# Patient Record
Sex: Male | Born: 1975 | Race: White | Hispanic: No | Marital: Married | Smoking: Current every day smoker
Health system: Southern US, Community
[De-identification: ages and names within clinical notes are randomized; demographics above are authoritative.]

## PROBLEM LIST (undated history)

## (undated) DIAGNOSIS — F988 Other specified behavioral and emotional disorders with onset usually occurring in childhood and adolescence: Secondary | ICD-10-CM

---

## 1999-06-29 ENCOUNTER — Emergency Department (HOSPITAL_COMMUNITY): Admission: EM | Admit: 1999-06-29 | Discharge: 1999-06-29 | Payer: Self-pay | Admitting: Emergency Medicine

## 1999-09-23 ENCOUNTER — Emergency Department (HOSPITAL_COMMUNITY): Admission: EM | Admit: 1999-09-23 | Discharge: 1999-09-23 | Payer: Self-pay | Admitting: Emergency Medicine

## 1999-09-23 ENCOUNTER — Encounter: Payer: Self-pay | Admitting: Emergency Medicine

## 2001-02-01 ENCOUNTER — Encounter: Payer: Self-pay | Admitting: Emergency Medicine

## 2001-02-01 ENCOUNTER — Emergency Department (HOSPITAL_COMMUNITY): Admission: EM | Admit: 2001-02-01 | Discharge: 2001-02-01 | Payer: Self-pay | Admitting: Emergency Medicine

## 2002-06-14 ENCOUNTER — Emergency Department (HOSPITAL_COMMUNITY): Admission: EM | Admit: 2002-06-14 | Discharge: 2002-06-14 | Payer: Self-pay

## 2002-07-29 ENCOUNTER — Emergency Department (HOSPITAL_COMMUNITY): Admission: EM | Admit: 2002-07-29 | Discharge: 2002-07-29 | Payer: Self-pay | Admitting: Emergency Medicine

## 2003-12-07 ENCOUNTER — Emergency Department (HOSPITAL_COMMUNITY): Admission: EM | Admit: 2003-12-07 | Discharge: 2003-12-07 | Payer: Self-pay | Admitting: Emergency Medicine

## 2003-12-22 ENCOUNTER — Emergency Department (HOSPITAL_COMMUNITY): Admission: EM | Admit: 2003-12-22 | Discharge: 2003-12-22 | Payer: Self-pay | Admitting: Emergency Medicine

## 2003-12-23 ENCOUNTER — Emergency Department (HOSPITAL_COMMUNITY): Admission: EM | Admit: 2003-12-23 | Discharge: 2003-12-23 | Payer: Self-pay | Admitting: Emergency Medicine

## 2003-12-24 ENCOUNTER — Emergency Department (HOSPITAL_COMMUNITY): Admission: EM | Admit: 2003-12-24 | Discharge: 2003-12-24 | Payer: Self-pay | Admitting: Emergency Medicine

## 2003-12-27 ENCOUNTER — Emergency Department (HOSPITAL_COMMUNITY): Admission: EM | Admit: 2003-12-27 | Discharge: 2003-12-27 | Payer: Self-pay | Admitting: Emergency Medicine

## 2004-01-31 ENCOUNTER — Emergency Department (HOSPITAL_COMMUNITY): Admission: EM | Admit: 2004-01-31 | Discharge: 2004-01-31 | Payer: Self-pay | Admitting: Emergency Medicine

## 2004-02-25 ENCOUNTER — Emergency Department (HOSPITAL_COMMUNITY): Admission: EM | Admit: 2004-02-25 | Discharge: 2004-02-26 | Payer: Self-pay | Admitting: Emergency Medicine

## 2004-03-19 ENCOUNTER — Emergency Department (HOSPITAL_COMMUNITY): Admission: EM | Admit: 2004-03-19 | Discharge: 2004-03-19 | Payer: Self-pay | Admitting: Emergency Medicine

## 2006-06-22 ENCOUNTER — Emergency Department (HOSPITAL_COMMUNITY): Admission: EM | Admit: 2006-06-22 | Discharge: 2006-06-22 | Payer: Self-pay | Admitting: Pediatrics

## 2007-02-01 ENCOUNTER — Emergency Department (HOSPITAL_COMMUNITY): Admission: EM | Admit: 2007-02-01 | Discharge: 2007-02-01 | Payer: Self-pay | Admitting: Emergency Medicine

## 2007-10-06 ENCOUNTER — Emergency Department (HOSPITAL_COMMUNITY): Admission: EM | Admit: 2007-10-06 | Discharge: 2007-10-06 | Payer: Self-pay | Admitting: Emergency Medicine

## 2008-01-27 ENCOUNTER — Ambulatory Visit: Payer: Self-pay | Admitting: Family Medicine

## 2008-01-27 ENCOUNTER — Observation Stay (HOSPITAL_COMMUNITY): Admission: EM | Admit: 2008-01-27 | Discharge: 2008-01-28 | Payer: Self-pay | Admitting: Emergency Medicine

## 2008-06-05 ENCOUNTER — Emergency Department (HOSPITAL_COMMUNITY): Admission: EM | Admit: 2008-06-05 | Discharge: 2008-06-05 | Payer: Self-pay | Admitting: Family Medicine

## 2008-08-26 ENCOUNTER — Emergency Department (HOSPITAL_COMMUNITY): Admission: EM | Admit: 2008-08-26 | Discharge: 2008-08-26 | Payer: Self-pay | Admitting: Emergency Medicine

## 2008-09-27 ENCOUNTER — Emergency Department (HOSPITAL_COMMUNITY): Admission: EM | Admit: 2008-09-27 | Discharge: 2008-09-27 | Payer: Self-pay | Admitting: Emergency Medicine

## 2009-02-05 ENCOUNTER — Emergency Department (HOSPITAL_COMMUNITY): Admission: EM | Admit: 2009-02-05 | Discharge: 2009-02-05 | Payer: Self-pay | Admitting: Emergency Medicine

## 2010-04-13 LAB — POCT URINALYSIS DIP (DEVICE)
Nitrite: NEGATIVE
Protein, ur: NEGATIVE mg/dL
Urobilinogen, UA: 0.2 mg/dL (ref 0.0–1.0)
pH: 7 (ref 5.0–8.0)

## 2010-05-12 LAB — COMPREHENSIVE METABOLIC PANEL
ALT: 24 U/L (ref 0–53)
AST: 25 U/L (ref 0–37)
Albumin: 4 g/dL (ref 3.5–5.2)
Alkaline Phosphatase: 51 U/L (ref 39–117)
BUN: 14 mg/dL (ref 6–23)
Chloride: 104 mEq/L (ref 96–112)
GFR calc Af Amer: >60 mL/min (ref >60)
Potassium: 3.7 mEq/L (ref 3.5–5.1)
Sodium: 140 mEq/L (ref 135–145)
Total Protein: 7 g/dL (ref 6.0–8.3)

## 2010-05-12 LAB — CARDIAC PANEL(CRET KIN+CKTOT+MB+TROPI)
Relative Index: INVALID (ref 0.0–2.5)
Relative Index: INVALID (ref 0.0–2.5)
Total CK: 68 U/L (ref 7–232)

## 2010-05-12 LAB — RAPID URINE DRUG SCREEN, HOSP PERFORMED
Amphetamines: POSITIVE — AB
Barbiturates: NOT DETECTED
Cocaine: POSITIVE — AB
Opiates: NOT DETECTED

## 2010-05-12 LAB — DIFFERENTIAL
Basophils Relative: 1 % (ref 0–1)
Eosinophils Relative: 2 % (ref 0–5)
Monocytes Absolute: 0.7 10*3/uL (ref 0.1–1.0)
Monocytes Relative: 9 % (ref 3–12)
Neutro Abs: 4.4 10*3/uL (ref 1.7–7.7)

## 2010-05-12 LAB — CK TOTAL AND CKMB (NOT AT ARMC)
CK, MB: 1.8 ng/mL (ref 0.3–4.0)
Total CK: 94 U/L (ref 7–232)

## 2010-05-12 LAB — CBC
Platelets: 146 10*3/uL — ABNORMAL LOW (ref 150–400)
RDW: 13.1 % (ref 11.5–15.5)
WBC: 7.9 10*3/uL (ref 4.0–10.5)

## 2010-05-12 LAB — POCT CARDIAC MARKERS
CKMB, poc: <1 ng/mL — ABNORMAL LOW (ref 1.0–8.0)
Myoglobin, poc: 39.1 ng/mL (ref 12–200)
Troponin i, poc: <0.05 ng/mL (ref 0.00–0.09)

## 2010-06-10 NOTE — H&P (Signed)
NAME:  KIANDRE, SPAGNOLO NO.:  [id number]   MEDICAL RECORD NO.:  [id number]          PATIENT TYPE:  OBV   LOCATION:  3731                         FACILITY:  [facility]   PHYSICIAN:  Nestor Ramp, MD        DATE OF BIRTH:  Jun 26, 1975   DATE OF ADMISSION:  01/27/2008  DATE OF DISCHARGE:                              HISTORY & PHYSICAL   PRIMARY CARE PHYSICIAN:  Unassigned.   CHIEF COMPLAINT:  Chest pain status post cocaine use.   HISTORY OF PRESENT ILLNESS:  The patient, Jordan Garrett, is a 35-year-  old male complaining of left-sided anterior chest pain with radiation to  his left arm that is intermittent and lasts about 2 minutes.  It is not  associated with exertion.  He states that it feels like, someone is  standing on my chest, but is now feeling more achy.  He denies  diaphoresis and complains that these symptoms have lasted 1 day.  He has  a history of dyspepsia.  He took Pepcid with no relief.  He admits to an  alcohol binge as well as cocaine use yesterday for New Year's.  He  denies any other drug use.   PAST MEDICAL HISTORY:  GERD and broken nose.   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   MEDICATIONS:  Pepcid.   ALLERGIES:  None.   FAMILY HISTORY:  His mother is 58 year old, she is alive, had an MI  recently, she has hypertension, but no diabetes.  He has 2 brothers that  are in good health and 2 children that are in good health.   SOCIAL HISTORY:  He smokes one and half packs per day and has for 10  years.  Alcohol, he binges occasionally, but previously drank a 6-pack  to 12-pack daily, he stopped 2 months ago but relapsed last night.  He  admits to cocaine use.  He lives with his wife and children.  He is a  laid off landscaper.   REVIEW OF SYSTEMS:  Per HPI, otherwise negative.  He denies headache,  vision changes, fever, chills, dysuria, joint aches and pains, rashes,  or night sweats.  He does endorse cough, nausea,  and vomiting x1 that  was nonbilious and nonbloody.  He has shortness of breath with his chest  pain.  He has had 3 episodes of diarrhea without blood.  He does  complain of polyuria and polydipsia.   PHYSICAL EXAMINATION:  VITAL SIGNS:  Temp 98.7, blood pressure 126/96,  heart rate 85, respiratory rate 18, and O2 97-100% on room air.  GENERAL:  He is alert and oriented x3 in no apparent distress.  HEENT:  Normocephalic and atraumatic.  Pupils are equally round and  reactive to light.  Extraocular muscles are intact.  Oropharynx is pink  and moist.  He has no JVD.  HEART:  Regular rate and rhythm.  No murmurs, rubs, or gallops.  LUNGS:  Clear to auscultation bilaterally.  ABDOMEN:  Soft and nontender.  Bowel sounds x4.  EXTREMITIES:  No cyanosis, clubbing, or edema.  EKG showed RSR-prime pattern in V1 and V2, new since May 2008 with  slight J-point increases in V1-V3 similar to his prior.  Chest x-ray  showed no acute findings.   White blood cell count 7.9, hemoglobin 14.4, hematocrit 42.7, and  platelets 146.  Sodium 140, potassium 3.7, chloride 104, CO2 of 26, BUN  14, creatinine 0.88, glucose 108, calcium 9.22, protein 7.0, albumin  4.0, AST 25, ALT 24, alk phos 51, and T-bili 0.6.  D-dimer 0.34.  Point-  of-care enzymes are negative.   ASSESSMENT/PLAN:  The patient is a 35 year old male with cocaine-induced  chest pain who will be observed overnight for rule out.  1. Chest pain, rule out myocardial infarction.  We will cycle cardiac      enzymes.  We know the point-of-care enzymes were negative.  He does      have an EKG with a new RVR-prime in V1 and V2 as compared to his      prior EKG.  We will repeat an EKG in the morning.  We will give him      aspirin for cardiac protection.  We will give him Tylenol,      morphine, and Ativan for chest pain.  We will observe him      overnight.  2. Questionable hyperglycemia on BMP as well as a history of polyuria      and polydipsia.   Check a CBG in the morning.  3. Fluids, electrolytes, nutrition and gastrointestinal.  Give him      regular diet and Protonix.  4. Prophylaxis.  Early ambulation, PPI.   DISPOSITION:  Pending serial enzymes.      Helane Rima, MD  Electronically Signed      Nestor Ramp, MD  Electronically Signed    EW/MEDQ  D:  01/28/2008  T:  01/28/2008  Job:  [id number]

## 2010-06-13 NOTE — Discharge Summary (Signed)
NAME:  Jordan Garrett, Jordan Garrett NO.:  [id number]   MEDICAL RECORD NO.:  [id number]          PATIENT TYPE:  OBV   LOCATION:  3731                         FACILITY:  [facility]   PHYSICIAN:  Nestor Ramp, MD        DATE OF BIRTH:  1975/11/07   DATE OF ADMISSION:  01/27/2008  DATE OF DISCHARGE:  01/28/2008                               DISCHARGE SUMMARY   DISCHARGE DIAGNOSES:  1. Cocaine-induced chest pain.  2. Tobacco abuse.  3. Gastroesophageal reflux disease.   DISCHARGE MEDICATIONS:  None.   PROCEDURES:  Chest x-ray on January 27, 2008.  Impression, there is no  active disease.   LABORATORY DATA:  White blood cell count 7.9, hemoglobin 40.4,  hematocrit 42.7, platelets 146.  Sodium 140, potassium 3.7, chloride  104, CO2 of 26, glucose 102, BUN 14, creatinine 0.88, total bilirubin  0.6, alk phos 551, AST 25, ALT 24, total protein 7.0, albumin 4.0,  calcium 9.2.  Cardiac enzymes were negative x3.   BRIEF HOSPITAL COURSE:  Jordan Garrett is a 35 year old male that was  admitted for chest pain status post cocaine use and was observed  overnight for MI rule out.  1. Chest pain.  The patient's pain was attributed to his recent      cocaine abuse.  He was monitored on telemetry overnight and      remained stable.  Serial enzymes were cycled and were negative x3.      The patient was counseled against of cocaine abuse and voiced      understanding.  Repeat morning EKG was negative.  Chest x-ray was      negative.  2. Tobacco abuse.  The patient was treated with a nicotine patch      during his hospital stay.  He was counseled regarding the benefits      of smoking cessation in the risks of continued smoking.  3. Gastroesophageal reflux disease.  The patient was treated with      Protonix.  He declined a prescription for PPI and wished to take      over-the-counter.  4. Followup with his own primary care physician at [facility] and was      encouraged to make an appointment  within 2 weeks.  The patient was      discharged in good condition.      Helane Rima, MD  Electronically Signed      Nestor Ramp, MD  Electronically Signed    EW/MEDQ  D:  02/28/2008  T:  02/29/2008  Job:  [id number]

## 2010-10-15 LAB — DIFFERENTIAL
Lymphs Abs: 1.9
Monocytes Relative: 6
Neutro Abs: 7.2
Neutrophils Relative %: 73

## 2010-10-15 LAB — CBC
RBC: 4.79
WBC: 9.9

## 2011-08-31 ENCOUNTER — Encounter (HOSPITAL_COMMUNITY): Payer: Self-pay | Admitting: *Deleted

## 2011-08-31 ENCOUNTER — Emergency Department (HOSPITAL_COMMUNITY)
Admission: EM | Admit: 2011-08-31 | Discharge: 2011-08-31 | Disposition: A | Discharge status: Home / Self Care | Payer: Self-pay | Attending: Emergency Medicine | Admitting: Emergency Medicine

## 2011-08-31 DIAGNOSIS — W57XXXA Bitten or stung by nonvenomous insect and other nonvenomous arthropods, initial encounter: Secondary | ICD-10-CM

## 2011-08-31 DIAGNOSIS — Z79899 Other long term (current) drug therapy: Secondary | ICD-10-CM | POA: Insufficient documentation

## 2011-08-31 DIAGNOSIS — F17200 Nicotine dependence, unspecified, uncomplicated: Secondary | ICD-10-CM | POA: Insufficient documentation

## 2011-08-31 DIAGNOSIS — S90569A Insect bite (nonvenomous), unspecified ankle, initial encounter: Secondary | ICD-10-CM | POA: Insufficient documentation

## 2011-08-31 DIAGNOSIS — F988 Other specified behavioral and emotional disorders with onset usually occurring in childhood and adolescence: Secondary | ICD-10-CM | POA: Insufficient documentation

## 2011-08-31 HISTORY — DX: Other specified behavioral and emotional disorders with onset usually occurring in childhood and adolescence: F98.8

## 2011-08-31 MED ORDER — IBUPROFEN 800 MG PO TABS
800.0000 mg | ORAL_TABLET | Freq: Once | ORAL | Status: AC
  Administered 2011-08-31: 800 mg via ORAL
  Filled 2011-08-31: qty 1

## 2011-08-31 MED ORDER — DIPHENHYDRAMINE HCL 25 MG PO CAPS
25.0000 mg | ORAL_CAPSULE | Freq: Once | ORAL | Status: AC
  Administered 2011-08-31: 25 mg via ORAL
  Filled 2011-08-31: qty 1

## 2011-08-31 MED ORDER — IBUPROFEN 800 MG PO TABS: 800.0000 mg | ORAL_TABLET | Freq: Three times a day (TID) | ORAL | Status: AC

## 2011-08-31 MED ORDER — DIPHENHYDRAMINE HCL 25 MG PO TABS: 25.0000 mg | ORAL_TABLET | Freq: Four times a day (QID) | ORAL | Status: DC

## 2011-08-31 NOTE — ED Notes (Signed)
Pt alert and oriented x4. Respirations even and unlabored, bilateral symmetrical rise and fall of chest. Skin warm and dry. In no acute distress. Denies needs.   

## 2011-08-31 NOTE — ED Notes (Signed)
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 At first pt felt "sick and nauseas", at present pt reports pain 7/10 pain. Small laceration noted to shin. Denies LOC.

## 2011-09-01 NOTE — ED Provider Notes (Signed)
Medical screening examination/treatment/procedure(s) were performed by non-physician practitioner and as supervising physician I was immediately available for consultation/collaboration.   Celene Kras, MD 09/01/11 2104

## 2011-09-01 NOTE — ED Provider Notes (Signed)
History     CSN: [id number]  Arrival date & time 08/31/11  1102   First MD Initiated Contact with Patient 08/31/11 1150      Chief Complaint  Patient presents with  . Leg Pain    (Consider location/radiation/quality/duration/timing/severity/associated sxs/prior treatment) HPI History from patient. 36 year old male who presents with pain to his left lower leg. He states that he was working in a yard [employer] when he felt as if something might have bitten him on the lower leg. He had pain to the area. He did not see anything bite him. No treatment prior to coming here. States that he did feel slightly nauseated after this happened and had a generalized headache which seems to be improving at the present time. No associated abd pain. Has not noted any bleeding or skin color changes at the site of the wound.  Past Medical History  Diagnosis Date  . ADD (attention deficit disorder)     No past surgical history on file.  No family history on file.  History  Substance Use Topics  . Smoking status: Current Everyday Smoker -- 1.0 packs/day for 15 years    Types: Cigarettes  . Smokeless tobacco: Not on file  . Alcohol Use: No      Review of Systems  Constitutional: Negative.   Gastrointestinal: Positive for nausea.  Skin: Positive for wound.  ROS as per HPI  Allergies  Review of patient's allergies indicates no known allergies.  Home Medications   Current Outpatient Rx  Name Route Sig Dispense Refill  . AMPHETAMINE-DEXTROAMPHETAMINE 20 MG PO TABS Oral Take 20 mg by mouth 2 (two) times daily.    Marland Kitchen DIPHENHYDRAMINE HCL 25 MG PO TABS Oral Take 1 tablet (25 mg total) by mouth every 6 (six) hours. 20 tablet 0  . IBUPROFEN 800 MG PO TABS Oral Take 1 tablet (800 mg total) by mouth 3 (three) times daily. 21 tablet 0    BP 121/77  Pulse 82  Temp 98.3 F (36.8 C) (Oral)  Resp 16  SpO2 99%  Physical Exam  Nursing note and vitals reviewed. Constitutional: He is oriented  to person, place, and time. He appears well-developed and well-nourished. No distress.  HENT:  Head: Normocephalic and atraumatic.  Eyes: EOM are normal. Pupils are equal, round, and reactive to light.  Neck: Normal range of motion.  Cardiovascular: Normal rate, regular rhythm and normal heart sounds.   Pulmonary/Chest: Effort normal and breath sounds normal.  Abdominal: Soft. There is no tenderness.  Musculoskeletal: Normal range of motion.  Neurological: He is alert and oriented to person, place, and time. No cranial nerve deficit. He exhibits normal muscle tone. Coordination normal.  Skin: Skin is warm and dry. He is not diaphoretic.       Tiny puncture wound noted to L medial lower extremity, pink in appearance. No surrounding skin changes. Nontender to palpation.  Psychiatric: He has a normal mood and affect.    ED Course  Procedures (including critical care time)  Labs Reviewed - No data to display No results found.   1. Insect bite       MDM  Patient presents with a tiny puncture wound to his medial left lower extremity. No surrounding skin cellulitis or necrosis. Wound is clean appearing. Pt given Benadryl and ibuprofen here, felt better with this. Reasons to return discussed.    Khamauri Bauernfeind, PA-C 09/01/11 1023

## 2011-10-13 ENCOUNTER — Emergency Department (HOSPITAL_COMMUNITY)
Admission: EM | Admit: 2011-10-13 | Discharge: 2011-10-13 | Disposition: A | Discharge status: Home / Self Care | Payer: Self-pay | Attending: Emergency Medicine | Admitting: Emergency Medicine

## 2011-10-13 ENCOUNTER — Encounter (HOSPITAL_COMMUNITY): Payer: Self-pay | Admitting: Emergency Medicine

## 2011-10-13 DIAGNOSIS — F988 Other specified behavioral and emotional disorders with onset usually occurring in childhood and adolescence: Secondary | ICD-10-CM | POA: Insufficient documentation

## 2011-10-13 DIAGNOSIS — Y9269 Other specified industrial and construction area as the place of occurrence of the external cause: Secondary | ICD-10-CM | POA: Insufficient documentation

## 2011-10-13 DIAGNOSIS — Z79899 Other long term (current) drug therapy: Secondary | ICD-10-CM | POA: Insufficient documentation

## 2011-10-13 DIAGNOSIS — S61209A Unspecified open wound of unspecified finger without damage to nail, initial encounter: Secondary | ICD-10-CM | POA: Insufficient documentation

## 2011-10-13 DIAGNOSIS — W278XXA Contact with other nonpowered hand tool, initial encounter: Secondary | ICD-10-CM | POA: Insufficient documentation

## 2011-10-13 DIAGNOSIS — Z23 Encounter for immunization: Secondary | ICD-10-CM | POA: Insufficient documentation

## 2011-10-13 DIAGNOSIS — F17200 Nicotine dependence, unspecified, uncomplicated: Secondary | ICD-10-CM | POA: Insufficient documentation

## 2011-10-13 DIAGNOSIS — S61219A Laceration without foreign body of unspecified finger without damage to nail, initial encounter: Secondary | ICD-10-CM

## 2011-10-13 MED ORDER — LIDOCAINE HCL (PF) 1 % IJ SOLN
2.0000 mL | Freq: Once | INTRAMUSCULAR | Status: AC
  Administered 2011-10-13: 2 mL via INTRADERMAL
  Filled 2011-10-13: qty 5

## 2011-10-13 MED ORDER — CEPHALEXIN 500 MG PO CAPS: 500.0000 mg | ORAL_CAPSULE | Freq: Two times a day (BID) | ORAL | Status: DC

## 2011-10-13 MED ORDER — OXYCODONE-ACETAMINOPHEN 5-325 MG PO TABS
1.0000 | ORAL_TABLET | Freq: Once | ORAL | Status: AC
  Administered 2011-10-13: 1 via ORAL
  Filled 2011-10-13: qty 1

## 2011-10-13 MED ORDER — TETANUS-DIPHTH-ACELL PERTUSSIS 5-2.5-18.5 LF-MCG/0.5 IM SUSP
0.5000 mL | Freq: Once | INTRAMUSCULAR | Status: AC
  Administered 2011-10-13: 0.5 mL via INTRAMUSCULAR
  Filled 2011-10-13 (×2): qty 0.5

## 2011-10-13 NOTE — ED Notes (Signed)
Asked to clean and suture wound by Dr. Bebe Shaggy.   LACERATION REPAIR Performed by: Carolee Rota Authorized by: Carolee Rota Consent: Verbal consent obtained. Risks and benefits: risks, benefits and alternatives were discussed Consent given by: patient Patient identity confirmed: provided demographic data Prepped and Draped in normal sterile fashion Wound explored  Laceration Location: L 5th digit  Laceration Length: 2 cm  No Foreign Bodies seen or palpated  Anesthesia: local infiltration  Local anesthetic: lidocaine 2% without epinephrine  Anesthetic total: 2 ml  Irrigation method: syringe  Amount of cleaning: standard  Skin closure: 5-0 Ethilon  Number of sutures: 2  Technique: simple interrupted  Patient tolerance: Patient tolerated the procedure well with no immediate complications.  The patient was urged to return to the Emergency Department urgently with worsening pain, swelling, expanding erythema especially if it streaks away from the affected area, fever, or if they have any other concerns. Patient verbalized understanding.   Patient informed of need of suture removal in 7 days.     Renne Crigler, [place] 10/13/11 1302

## 2011-10-13 NOTE — ED Provider Notes (Signed)
History  This chart was scribed for Joya Gaskins, MD by Ladona Ridgel Day. This patient was seen in room TR02C/TR02C and the patient's care was started at 1030.   CSN: [id number]  Arrival date & time 10/13/11  1030   First MD Initiated Contact with Patient 10/13/11 1143      Chief Complaint  Patient presents with  . Finger Injury   Patient is a 36 y.o. male presenting with skin laceration. The history is provided by the patient. No language interpreter was used.  Laceration  The incident occurred 1 to 2 hours ago. Pain location: left index finger. The laceration is 2 cm in size. The laceration mechanism was a a metal edge. The pain is mild. The pain has been constant since onset. He reports no foreign bodies present. His tetanus status is unknown.   Jordan Garrett is a 36 y.o. 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 He denies any crushing or blunt trauma to his finger and presents with a small cut along distal aspect of his finger which he states is painful. He states that he has injured. Last TD vaccine unknown.    Past Medical History  Diagnosis Date  . ADD (attention deficit disorder)     History reviewed. No pertinent past surgical history.  History reviewed. No pertinent family history.  History  Substance Use Topics  . Smoking status: Current Every Day Smoker -- 1.0 packs/day for 15 years    Types: Cigarettes  . Smokeless tobacco: Not on file  . Alcohol Use: No      Review of Systems  Constitutional: Negative for fever.  Cardiovascular: Negative for chest pain.  Gastrointestinal: Negative for abdominal pain.  Skin: Positive for wound (laceration distal aspect of left index finger).  Neurological: Negative for syncope.    Allergies  Review of patient's allergies indicates no known allergies.  Home Medications   Current Outpatient Rx  Name  Route Sig Dispense Refill  . AMPHETAMINE-DEXTROAMPHETAMINE 20 MG PO TABS Oral Take 20 mg by mouth 2 (two) times daily.    Marland Kitchen DIPHENHYDRAMINE HCL 25 MG PO TABS Oral Take 1 tablet (25 mg total) by mouth every 6 (six) hours. 20 tablet 0    Triage Vitals: BP 125/82  Pulse 103  Temp 97.9 F (36.6 C) (Oral)  Resp 18  SpO2 99%  Physical Exam CONSTITUTIONAL: Well developed/well nourished HEAD AND FACE: Normocephalic/atraumatic EYES: EOMI ENMT: Mucous membranes moist NECK: supple no meningeal signs SPINE:entire spine nontender CV: S1/S2 noted, no murmurs/rubs/gallops noted LUNGS: Lungs are clear to auscultation bilaterally, no apparent distress ABDOMEN: soft, nontender, no rebound or guarding NEURO: Pt is awake/alert, moves all extremitiesx4 EXTREMITIES: pulses normal, full ROM SKIN: warm, color normal, laceration to lateral aspect of left index finger. No bony tenderness to aspect of left finger,  nail intact, no hematoma, full ROM of finger PSYCH: no abnormalities of mood noted  ED Course  Procedures  DIAGNOSTIC STUDIES: Oxygen Saturation is 99% on room air, normal by my interpretation.    COORDINATION OF CARE: At 1145 AM Discussed treatment plan with patient which includes wound cleansing and dressing, TD vacinne. Patient agrees.    laceration repaired by PA.  Pt stable for d/c    MDM  Nursing notes including past medical history and social history reviewed and considered in documentation    I personally performed the services described in this  documentation, which was scribed in my presence. The recorded information has been reviewed and considered.          Joya Gaskins, MD 10/13/11 1308

## 2011-10-13 NOTE — ED Notes (Signed)
NAD noted at time of d/c home 

## 2011-10-13 NOTE — ED Notes (Signed)
Pt with laceration to tip of left index finger with hedge trimmers; no bleeding noted; unknown last TD

## 2011-10-24 ENCOUNTER — Encounter (HOSPITAL_COMMUNITY): Payer: Self-pay | Admitting: Emergency Medicine

## 2011-10-24 ENCOUNTER — Emergency Department (INDEPENDENT_AMBULATORY_CARE_PROVIDER_SITE_OTHER)
Admission: EM | Admit: 2011-10-24 | Discharge: 2011-10-24 | Disposition: A | Discharge status: Home / Self Care | Payer: Worker's Compensation | Source: Home / Self Care

## 2011-10-24 DIAGNOSIS — Z4802 Encounter for removal of sutures: Secondary | ICD-10-CM

## 2011-10-24 NOTE — ED Notes (Signed)
Pt is here for a f/u on laceration to left index finger... Laceration is about 2 cm... Pt denies: any drainage, fevers, vomiting, nausea, diarrhea.

## 2015-02-16 ENCOUNTER — Emergency Department (HOSPITAL_COMMUNITY)
Admission: EM | Admit: 2015-02-16 | Discharge: 2015-02-16 | Disposition: A | Discharge status: Home / Self Care | Payer: Self-pay | Attending: Emergency Medicine | Admitting: Emergency Medicine

## 2015-02-16 ENCOUNTER — Encounter (HOSPITAL_COMMUNITY): Payer: Self-pay | Admitting: *Deleted

## 2015-02-16 DIAGNOSIS — Z4802 Encounter for removal of sutures: Secondary | ICD-10-CM | POA: Insufficient documentation

## 2015-02-16 DIAGNOSIS — Z792 Long term (current) use of antibiotics: Secondary | ICD-10-CM | POA: Insufficient documentation

## 2015-02-16 DIAGNOSIS — F909 Attention-deficit hyperactivity disorder, unspecified type: Secondary | ICD-10-CM | POA: Insufficient documentation

## 2015-02-16 DIAGNOSIS — F17210 Nicotine dependence, cigarettes, uncomplicated: Secondary | ICD-10-CM | POA: Insufficient documentation

## 2015-02-16 DIAGNOSIS — I10 Essential (primary) hypertension: Secondary | ICD-10-CM | POA: Insufficient documentation

## 2015-02-16 NOTE — ED Notes (Signed)
Pt here fur suture removal. Has been in for about 10 days.

## 2015-02-16 NOTE — Discharge Instructions (Signed)
Hypertension Hypertension, commonly called high blood pressure, is when the force of blood pumping through your arteries is too strong. Your arteries are the blood vessels that carry blood from your heart throughout your body. A blood pressure reading consists of a higher number over a lower number, such as 110/72. The higher number (systolic) is the pressure inside your arteries when your heart pumps. The lower number (diastolic) is the pressure inside your arteries when your heart relaxes. Ideally you want your blood pressure below 120/80. Hypertension forces your heart to work harder to pump blood. Your arteries may become narrow or stiff. Having untreated or uncontrolled hypertension can cause heart attack, stroke, kidney disease, and other problems. RISK FACTORS Some risk factors for high blood pressure are controllable. Others are not.  Risk factors you cannot control include:   Race. You may be at higher risk if you are African American.  Age. Risk increases with age.  Gender. Men are at higher risk than women before age 45 years. After age 65, women are at higher risk than men. Risk factors you can control include:  Not getting enough exercise or physical activity.  Being overweight.  Getting too much fat, sugar, calories, or salt in your diet.  Drinking too much alcohol. SIGNS AND SYMPTOMS Hypertension does not usually cause signs or symptoms. Extremely high blood pressure (hypertensive crisis) may cause headache, anxiety, shortness of breath, and nosebleed. DIAGNOSIS To check if you have hypertension, your health care provider will measure your blood pressure while you are seated, with your arm held at the level of your heart. It should be measured at least twice using the same arm. Certain conditions can cause a difference in blood pressure between your right and left arms. A blood pressure reading that is higher than normal on one occasion does not mean that you need treatment. If  it is not clear whether you have high blood pressure, you may be asked to return on a different day to have your blood pressure checked again. Or, you may be asked to monitor your blood pressure at home for 1 or more weeks. TREATMENT Treating high blood pressure includes making lifestyle changes and possibly taking medicine. Living a healthy lifestyle can help lower high blood pressure. You may need to change some of your habits. Lifestyle changes may include:  Following the DASH diet. This diet is high in fruits, vegetables, and whole grains. It is low in salt, red meat, and added sugars.  Keep your sodium intake below 2,300 mg per day.  Getting at least 30-45 minutes of aerobic exercise at least 4 times per week.  Losing weight if necessary.  Not smoking.  Limiting alcoholic beverages.  Learning ways to reduce stress. Your health care provider may prescribe medicine if lifestyle changes are not enough to get your blood pressure under control, and if one of the following is true:  You are 18-59 years of age and your systolic blood pressure is above 140.  You are 60 years of age or older, and your systolic blood pressure is above 150.  Your diastolic blood pressure is above 90.  You have diabetes, and your systolic blood pressure is over 140 or your diastolic blood pressure is over 90.  You have kidney disease and your blood pressure is above 140/90.  You have heart disease and your blood pressure is above 140/90. Your personal target blood pressure may vary depending on your medical conditions, your age, and other factors. HOME CARE INSTRUCTIONS    Have your blood pressure rechecked as directed by your health care provider.   Take medicines only as directed by your health care provider. Follow the directions carefully. Blood pressure medicines must be taken as prescribed. The medicine does not work as well when you skip doses. Skipping doses also puts you at risk for  problems.  Do not smoke.   Monitor your blood pressure at home as directed by your health care provider. SEEK MEDICAL CARE IF:   You think you are having a reaction to medicines taken.  You have recurrent headaches or feel dizzy.  You have swelling in your ankles.  You have trouble with your vision. SEEK IMMEDIATE MEDICAL CARE IF:  You develop a severe headache or confusion.  You have unusual weakness, numbness, or feel faint.  You have severe chest or abdominal pain.  You vomit repeatedly.  You have trouble breathing. MAKE SURE YOU:   Understand these instructions.  Will watch your condition.  Will get help right away if you are not doing well or get worse.   This information is not intended to replace advice given to you by your health care provider. Make sure you discuss any questions you have with your health care provider.   Document Released: 01/12/2005 Document Revised: 05/29/2014 Document Reviewed: 11/04/2012 Elsevier Interactive Patient Education 2016 [employer]  

## 2015-02-18 NOTE — ED Provider Notes (Signed)
CSN: [id number]     Arrival date & time 02/16/15  1859 History   First MD Initiated Contact with Patient 02/16/15 1922     Chief Complaint  Patient presents with  . Suture / Staple Removal     (Consider location/radiation/quality/duration/timing/severit y/associated sxs/prior Treatment) HPI   Jordan Garrett is a 40 y.o. male who presents to the Emergency Department requesting suture removal.  He states he had a cut to his right hand 10 days ago and had sutures placed at another facility.  He states there is a small area in the middle of the laceration that didn't close. He denies numbness, redness, swelling, pain, weakness or difficulty moving his fingers.    Past Medical History  Diagnosis Date  . ADD (attention deficit disorder)    History reviewed. No pertinent past surgical history. No family history on file. Social History  Substance Use Topics  . Smoking status: Current Every Day Smoker -- 1.00 packs/day for 15 years    Types: Cigarettes  . Smokeless tobacco: None  . Alcohol Use: No    Review of Systems  Constitutional: Negative for fever and chills.  Musculoskeletal: Negative for joint swelling and arthralgias.  Skin: Positive for wound.       Laceration   Neurological: Negative for dizziness, weakness and numbness.  Hematological: Does not bruise/bleed easily.  All other systems reviewed and are negative.     Allergies  Review of patient's allergies indicates no known allergies.  Home Medications   Prior to Admission medications   Medication Sig Start Date End Date Taking? Authorizing Provider  amphetamine-dextroamphetamine (ADDERALL) 20 MG tablet Take 20 mg by mouth 2 (two) times daily.    Historical Provider, MD  cephALEXin (KEFLEX) 500 MG capsule Take 1 capsule (500 mg total) by mouth 2 (two) times daily. 10/13/11   Zadie Rhine, MD  diphenhydrAMINE (BENADRYL) 25 MG tablet Take 1 tablet (25 mg total) by mouth every 6 (six) hours. 08/31/11 09/30/11   Grant Fontana, PA-C   BP 168/94 mmHg  Pulse 73  Temp(Src) 98.2 F (36.8 C) (Oral)  Resp 20  Ht  (1.753 m)  Wt 77.111 kg  BMI 25.09 kg/m2  SpO2 100% Physical Exam  Constitutional: He is oriented to person, place, and time. He appears well-developed and well-nourished. No distress.  HENT:  Head: Normocephalic and atraumatic.  Cardiovascular: Normal rate and intact distal pulses.   No murmur heard. Pulmonary/Chest: Effort normal and breath sounds normal. No respiratory distress.  Musculoskeletal: He exhibits no edema or tenderness.  Pt has full ROM of the right first and second fingers.  Distal sensation intact.   Neurological: He is alert and oriented to person, place, and time. He exhibits normal muscle tone. Coordination normal.  Skin: Skin is warm. Laceration noted.  Laceration to the first web space of the right hand.  Lac extends along the thenar eminence of the thumb.  Sutures in place with a 2 mm area of wound dehiscence no edema or erythema.     Nursing note and vitals reviewed.   ED Course  Procedures (including critical care time) Labs Review Labs Reviewed - No data to display  Imaging Review No results found. I have personally reviewed and evaluated these images and lab results as part of my medical decision-making.   EKG Interpretation None      MDM   Final diagnoses:  Visit for suture removal  Essential hypertension    Pt is well appearing.  Hypertensive, but  states he becomes hypertensive when he goes to hospitals.  Denies any symptoms.  Appears stable for d/c.  Advised pt that he needs PMD f/u regarding BP.    Sutures removed without difficulty.  Steri-strips applied     Pauline Aus, PA-C 02/18/15 2007  Cathren Laine, MD 02/19/15 [phone]

## 2015-03-15 ENCOUNTER — Encounter (HOSPITAL_COMMUNITY): Payer: Self-pay | Admitting: Emergency Medicine

## 2015-03-15 ENCOUNTER — Emergency Department (HOSPITAL_COMMUNITY)
Admission: EM | Admit: 2015-03-15 | Discharge: 2015-03-15 | Disposition: A | Discharge status: Home / Self Care | Payer: Self-pay | Attending: Emergency Medicine | Admitting: Emergency Medicine

## 2015-03-15 DIAGNOSIS — Z79899 Other long term (current) drug therapy: Secondary | ICD-10-CM | POA: Insufficient documentation

## 2015-03-15 DIAGNOSIS — F17210 Nicotine dependence, cigarettes, uncomplicated: Secondary | ICD-10-CM | POA: Insufficient documentation

## 2015-03-15 DIAGNOSIS — F909 Attention-deficit hyperactivity disorder, unspecified type: Secondary | ICD-10-CM | POA: Insufficient documentation

## 2015-03-15 DIAGNOSIS — K0889 Other specified disorders of teeth and supporting structures: Secondary | ICD-10-CM

## 2015-03-15 DIAGNOSIS — K029 Dental caries, unspecified: Secondary | ICD-10-CM | POA: Insufficient documentation

## 2015-03-15 DIAGNOSIS — Z792 Long term (current) use of antibiotics: Secondary | ICD-10-CM | POA: Insufficient documentation

## 2015-03-15 MED ORDER — IBUPROFEN 800 MG PO TABS: 800.0000 mg | ORAL_TABLET | Freq: Three times a day (TID) | ORAL | Status: DC

## 2015-03-15 MED ORDER — AMOXICILLIN 500 MG PO CAPS: 500.0000 mg | ORAL_CAPSULE | Freq: Three times a day (TID) | ORAL | Status: DC

## 2015-03-15 NOTE — Discharge Instructions (Signed)
Dental Pain  ° ° °Dental pain may be caused by many things, including:  °Tooth decay (cavities or caries). Cavities expose the nerve of your tooth to air and hot or cold temperatures. This can cause pain or discomfort.  °Abscess or infection. A dental abscess is a collection of infected pus from a bacterial infection in the inner part of the tooth (pulp). It usually occurs at the end of the tooth's root.  °Injury.  °An unknown reason (idiopathic). °Your pain may be mild or severe. It may only occur when:  °You are chewing.  °You are exposed to hot or cold temperature.  °You are eating or drinking sugary foods or beverages, such as soda or candy. °Your pain may also be constant.  °HOME CARE INSTRUCTIONS  °Watch your dental pain for any changes. The following actions may help to lessen any discomfort that you are feeling:  °Take medicines only as directed by your dentist.  °If you were prescribed an antibiotic medicine, finish all of it even if you start to feel better.  °Keep all follow-up visits as directed by your dentist. This is important.  °Do not apply heat to the outside of your face.  °Rinse your mouth or gargle with salt water if directed by your dentist. This helps with pain and swelling.  °You can make salt water by adding ¼ tsp of salt to 1 cup of warm water. °Apply ice to the painful area of your face:  °Put ice in a plastic bag.  °Place a towel between your skin and the bag.  °Leave the ice on for 20 minutes, 2-3 times per day. °Avoid foods or drinks that cause you pain, such as:  °Very hot or very cold foods or drinks.  °Sweet or sugary foods or drinks. °SEEK MEDICAL CARE IF:  °Your pain is not controlled with medicines.  °Your symptoms are worse.  °You have new symptoms. °SEEK IMMEDIATE MEDICAL CARE IF:  °You are unable to open your mouth.  °You are having trouble breathing or swallowing.  °You have a fever.  °Your face, neck, or jaw is swollen. °This information is not intended to replace advice  given to you by your health care provider. Make sure you discuss any questions you have with your health care provider.  °Document Released: 01/12/2005 Document Revised: 05/29/2014 Document Reviewed: 01/08/2014  °Elsevier Interactive Patient Education ©2016 Elsevier Inc.  ° ° °Emergency Department Resource Guide °1) Find a Doctor and Pay Out of Pocket °Although you won't have to find out who is covered by your insurance plan, it is a good idea to ask around and get recommendations. You will then need to call the office and see if the doctor you have chosen will accept you as a new patient and what types of options they offer for patients who are self-pay. Some doctors offer discounts or will set up payment plans for their patients who do not have insurance, but you will need to ask so you aren't surprised when you get to your appointment. ° °2) Contact Your Local Health Department °Not all health departments have doctors that can see patients for sick visits, but many do, so it is worth a call to see if yours does. If you don't know where your local health department is, you can check in your phone book. The CDC also has a tool to help you locate your state's health department, and many state websites also have listings of all of their local health departments. ° °  3) Find a Walk-in Clinic °If your illness is not likely to be very severe or complicated, you may want to try a walk in clinic. These are popping up all over the country in pharmacies, drugstores, and shopping centers. They're usually staffed by nurse practitioners or physician assistants that have been trained to treat common illnesses and complaints. They're usually fairly quick and inexpensive. However, if you have serious medical issues or chronic medical problems, these are probably not your best option. ° °No Primary Care Doctor: °- Call [facility] at  832-8000 - they can help you locate a primary care doctor that  accepts your insurance,  provides certain services, etc. °- Physician Referral Service- 1-800-533-3463 ° °Chronic Pain Problems: °Organization         Address  Phone   Notes  °[facility]  (336) 297-2271 Patients need to be referred by their primary care doctor.  ° °Medication Assistance: °Organization         Address  Phone   Notes  °[place] [facility] [address] °[place], [place] [address] (336) 641-8030 --Must be a resident of [place] °-- Must have NO insurance coverage whatsoever (no Medicaid/ Medicare, etc.) °-- The pt. MUST have a primary care doctor that directs their care regularly and follows them in the community °  °[facility]  (866) 331-1348   °[employer]  (888) 892-1162   ° °Agencies that provide inexpensive medical care: °Organization         Address  Phone   Notes  °[facility]  (336) 832-8035   °[facility]    (336) 832-7272   °[facility] [address] °[place], [place] [address] (336) 832-4777   °[facility] [address], °[place] (336) 271-4999   °[facility]    (336) 373-0678   °[facility]    (336) 272-1050   °[facility] ° [address], [place] Phone:  (336) 832-4444, Fax:  (336) 832-4440 Hours of Operation:  9 am - 6 pm, M-F.  Also accepts Medicaid/Medicare and self-pay.  °[facility] ° [address], [place] Phone: (336) 832-3150, Fax: (336) 832-3151. Hours of Operation:  8:30 am - 5:30 pm, M-F.  Also accepts Medicaid and self-pay.  °[facility] [place] [address], [place] Phone: (336) 878-6027   °[facility] [address], [place], [place] (336)723-1848, Ext. 123 Mondays & Thursdays: 7-9 AM.  First 15 patients are seen on a first come, first serve basis. °  ° °Medicaid-accepting [place] Providers: ° °Organization         Address  Phone    Notes  °[facility] [address], [place] (336) 641-2100 Also accepts self-pay patients.  °[facility] [address], [place] ° (336) 856-9996   °[facility] [address], [place] (336) 288-8857   °[facility] [address] [place] [address], [place] (336) 299-7000   °Veita Bland [address], [place]  ° (336) 373-1557 Only accepts [place] Access Medicaid patients after they have their name applied to their card.  ° °Self-Pay (no insurance) in [place]: ° °Organization         Address  Phone   Notes  °Sickle Cell Patients, [facility] [address], [place] (336) 832-1970   °[facility] [address], [place] (336) 832-4400   °[facility] ° [address], [place] (336) 992-4800   °[facility]/Dr. Osei-Bonsu ° [address] [place] [address], [place] or [address], [place] (336) 841-8500 Phone number for both [place] and [place] locations is the same.  °[facility] [address], [place] (336) 299-0000   °[facility] [address] [place] [address], [place] or [address] (336) 852-7530 °(336) 878-2260   °[facility] [address], [place] (336) 350-1642, phone; (336) 294-5005, fax Sees patients 1st and 3rd Saturday of every month.  Must not qualify for public or private insurance (i.e. Medicaid, Medicare, [place] Health Choice, Veterans' Benefits) • Household income should be no more than 200% of the poverty level •The clinic cannot treat you if you are pregnant or think you are pregnant • Sexually transmitted diseases are not treated at the clinic.  ° ° °Dental Care: °Organization         Address  Phone  Notes  °[facility] [address], [place] (336)  641-6152 Accepts children up to age 21 who are enrolled in Medicaid or [place] Health Choice; pregnant women with a Medicaid card; and children who have applied for Medicaid or [place] Health Choice, but were declined, whose parents can pay a reduced fee at time of service.  °[facility] [place]  [address], [place] (336) 641-7733 Accepts children up to age 21 who are enrolled in Medicaid or [place] Health Choice; pregnant women with a Medicaid card; and children who have applied for Medicaid or [place] Health Choice, but were declined, whose parents can pay a reduced fee at time of service.  °[facility] ° [address], [place] (336) 641-4533 Patients are seen by appointment only. Walk-ins are not accepted. [facility] will see patients 18 years of age and older. °Monday - Tuesday (8am-5pm) °Most Wednesdays (8:30-5pm) °$30 per visit, cash only  °[facility] ° [address], [place] (336) 641-4533 Patients are seen by appointment only. Walk-ins are not accepted. [facility] will see patients 18 years of age and older. °One Wednesday Evening (Monthly: Volunteer Based).  $30 per visit, cash only  °[facility]  (919) 537-3737 for adults; Children under age 4, call Graduate Pediatric Dentistry at ([phone]) [phone]. Children aged 4-14, please call (919) [phone] to request a pediatric application. ° Dental services are provided in all areas of dental care including fillings, crowns and bridges, complete and partial dentures, implants, gum treatment, root canals, and extractions. Preventive care is also provided. Treatment is provided to both adults and children. °Patients are selected via a lottery and there is often a waiting list. °  °[facility] [address], °[place] ° (336) 763-8833 [email or web address] °  °[facility] [address], [place], [place] (336)723-1848, Ext.  123 Second and Fourth Thursday of each month, opens at 6:30 AM; Clinic ends at 9 AM.  Patients are seen on a first-come first-served basis, and a limited number are seen during each clinic.  ° °[facility] ° [address], [place],  (336) 723-7904   Eligibility Requirements °You must have lived in [place],   [place], or [place] counties for at least the last three months. °  You cannot be eligible for state or federal sponsored healthcare insurance, including Veterans Administration, Medicaid, or Medicare. °  You generally cannot be eligible for healthcare insurance through your employer.  °  How to apply: °Eligibility screenings are held every Tuesday and Wednesday afternoon from 1:00 pm until 4:00 pm. You do not need an appointment for the interview!  °[facility] [address], [place], [place] 336-631-2330   °[facility]  336-342-8273   °[facility]  336-703-3100   °[facility]  336-570-6415   ° °Behavioral Health Resources in the Community: °Intensive Outpatient Programs °Organization         Address  Phone  Notes  °[facility] [address], [place], [place] 336-878-6098   °[facility] [address], [place], [place] 336-832-9800   °[facility] [address], [place], [place] ° 336-882-2125   °[facility] [address],  °[place] 1-800-853-5163 or 336-641-4981   °Substance Abuse Resources °Organization         Address  Phone  Notes  °[facility]  336-882-2125   °[facility]  336-784-9470   °[facility]  336-285-9073   °[facility]  336-845-3988   °Residential & Outpatient Substance Abuse Program  1-800-659-3381   °Psychological Services °Organization         Address  Phone  Notes  °[facility]  336- 832-9600   °[facility]  336- 378-7881   °[place] [address], [place] 1-800-853-5163 or 336-641-4981   ° °Mobile Crisis Teams °Organization         Address  Phone  Notes  °[facility], Mobile Crisis Care Unit  1-877-626-1772   °Assertive °[facility] ° [address] [place], [place] 336-834-9664   °Sharon DeEsch [address] °[place] 336-554-5454   ° °Self-Help/Support Groups °Organization         Address  Phone             Notes  °[facility] - variety of support groups  336- 373-1402 Call for more information  °Narcotics Anonymous (NA), [facility] [address], °[place]  2 meetings at this location  ° °Residential Treatment Programs °Organization         Address  Phone  Notes  °[facility] [address],    °[place]  1-866-801-8205   °[facility] ° [address], [place], [place] 704-293-8524   °[facility] [address], [place] 336-845-3988 Admissions: 8am-3pm M-F  °[facility] [address]    °[place], [place] 336-841-1104   °[facility] [address], [place], [place] 336-379-7146   °[facility] [address]  °[place], [place] 336-285-9073   °[facility] - Intensive Outpatient [address], [place], [place] 336-852-3033   °[facility] [address]  °[place], [place] 1-877-615-2722 or 336-784-9470   °[facility] [address] [place], [place] 336-227-7417 Accepts Medicaid  °[facility] [address]  °[place] 1-800-659-3381 Substance Abuse/Addiction Treatment  ° °[place] Behavioral Health Resources °Organization         Address  Phone  Notes  °[facility]  (888) 581-9988   °Julie Brannon, PhD [address] [place], [place]   (336) 349-5553 or (336) 951-0000   °[facility]   [address] °[place], [place] (336) 349-4454   °[facility] [address], [place], [place] (336) 342-8316 Insurance/Medicaid/sponsorship through [facility]  °[facility] [address]                                    [place], [place] (336) 342-8316 Therapy/tele-psych/case  °[facility] [address]  ° [place], [place] (336) 349-2233    °Dr. Arfeen  (336) 349-4544   °[facility] [place]  [employer] [place] [facility] 1) [address], [place] °2) [address], [place] °3)  [address], [place] (336) 349-3220 °(336) 342-7768 ° °(336) 342-8140   °[place] [facility] (336) 342-1394 or (336) 342-3537 (After Hours)    ° ° ° °

## 2015-03-15 NOTE — ED Provider Notes (Signed)
CSN: [id number]     Arrival date & time 03/15/15  0756 History   First MD Initiated Contact with Patient 03/15/15 0800     Chief Complaint  Patient presents with  . Dental Pain     (Consider location/radiation/quality/duration/timing/severity/associated sxs/prior Treatment) Patient is a 40 y.o. male presenting with tooth pain. The history is provided by the patient. No language interpreter was used.  Dental Pain Location:  Lower and upper Upper teeth location:  8/RU central incisor, 7/RU lateral incisor, 9/LU central incisor and 10/LU lateral incisor Quality:  Shooting Severity:  Moderate Onset quality:  Gradual Timing:  Constant Progression:  Worsening Chronicity:  New   Past Medical History  Diagnosis Date  . ADD (attention deficit disorder)    History reviewed. No pertinent past surgical history. History reviewed. No pertinent family history. Social History  Substance Use Topics  . Smoking status: Current Every Day Smoker -- 1.00 packs/day for 15 years    Types: Cigarettes  . Smokeless tobacco: None  . Alcohol Use: No    Review of Systems  All other systems reviewed and are negative.     Allergies  Review of patient's allergies indicates no known allergies.  Home Medications   Prior to Admission medications   Medication Sig Start Date End Date Taking? Authorizing Provider  amphetamine-dextroamphetamine (ADDERALL) 20 MG tablet Take 20 mg by mouth 2 (two) times daily.    Historical Provider, MD  cephALEXin (KEFLEX) 500 MG capsule Take 1 capsule (500 mg total) by mouth 2 (two) times daily. 10/13/11   Zadie Rhine, MD  diphenhydrAMINE (BENADRYL) 25 MG tablet Take 1 tablet (25 mg total) by mouth every 6 (six) hours. 08/31/11 09/30/11  Grant Fontana, PA-C   BP 150/97 mmHg  Pulse 97  Temp(Src) 97.6 F (36.4 C) (Oral)  Resp 18  Ht  (1.753 m)  Wt 72.576 kg  BMI 23.62 kg/m2  SpO2 100% Physical Exam  Constitutional: He is oriented to person, place, and  time. He appears well-developed and well-nourished.  HENT:  Head: Normocephalic and atraumatic.  Multiple decayed broken teeth,  Swelling upper right gumline  Eyes: Pupils are equal, round, and reactive to light.  Cardiovascular: Normal rate.   Pulmonary/Chest: Effort normal.  Musculoskeletal: Normal range of motion.  Neurological: He is alert and oriented to person, place, and time. He has normal reflexes.  Skin: Skin is warm.  Psychiatric: He has a normal mood and affect.  Nursing note reviewed.   ED Course  Procedures (including critical care time) Labs Review Labs Reviewed - No data to display  Imaging Review No results found. I have personally reviewed and evaluated these images and lab results as part of my medical decision-making.   EKG Interpretation None      MDM   Final diagnoses:  Toothache    Meds ordered this encounter  Medications  . amoxicillin (AMOXIL) 500 MG capsule    Sig: Take 1 capsule (500 mg total) by mouth 3 (three) times daily.    Dispense:  30 capsule    Refill:  0    Order Specific Question:  Supervising Provider    Answer:  MILLER, BRIAN [[id number]]  . ibuprofen (ADVIL,MOTRIN) 800 MG tablet    Sig: Take 1 tablet (800 mg total) by mouth 3 (three) times daily.    Dispense:  21 tablet    Refill:  0    Order Specific Question:  Supervising Provider    Answer:  Eber Hong [[id number]]  Elson Areas, PA-C 03/15/15 0829  Elson Areas, PA-C 03/15/15 1610  Eber Hong, MD 03/15/15 1500

## 2015-03-15 NOTE — ED Notes (Signed)
Pt states that he has had right upper dental pain for over a month.

## 2015-04-14 ENCOUNTER — Emergency Department (HOSPITAL_COMMUNITY)
Admission: EM | Admit: 2015-04-14 | Discharge: 2015-04-14 | Disposition: A | Discharge status: Home / Self Care | Payer: Self-pay | Attending: Emergency Medicine | Admitting: Emergency Medicine

## 2015-04-14 ENCOUNTER — Encounter (HOSPITAL_COMMUNITY): Payer: Self-pay | Admitting: *Deleted

## 2015-04-14 DIAGNOSIS — Z91011 Allergy to milk products: Secondary | ICD-10-CM | POA: Insufficient documentation

## 2015-04-14 DIAGNOSIS — F17210 Nicotine dependence, cigarettes, uncomplicated: Secondary | ICD-10-CM | POA: Insufficient documentation

## 2015-04-14 DIAGNOSIS — R21 Rash and other nonspecific skin eruption: Secondary | ICD-10-CM | POA: Insufficient documentation

## 2015-04-14 DIAGNOSIS — Z79899 Other long term (current) drug therapy: Secondary | ICD-10-CM | POA: Insufficient documentation

## 2015-04-14 DIAGNOSIS — T7840XA Allergy, unspecified, initial encounter: Secondary | ICD-10-CM | POA: Insufficient documentation

## 2015-04-14 MED ORDER — EPINEPHRINE 0.3 MG/0.3ML IJ SOAJ: 0.3000 mg | Freq: Once | INTRAMUSCULAR | Status: DC | PRN

## 2015-04-14 MED ORDER — FAMOTIDINE IN NACL 20-0.9 MG/50ML-% IV SOLN
20.0000 mg | INTRAVENOUS | Status: AC
  Administered 2015-04-14: 20 mg via INTRAVENOUS
  Filled 2015-04-14: qty 50

## 2015-04-14 MED ORDER — BENZONATATE 100 MG PO CAPS: 100.0000 mg | ORAL_CAPSULE | Freq: Three times a day (TID) | ORAL | Status: DC

## 2015-04-14 MED ORDER — IBUPROFEN 800 MG PO TABS: 800.0000 mg | ORAL_TABLET | Freq: Three times a day (TID) | ORAL | Status: DC

## 2015-04-14 MED ORDER — DIPHENHYDRAMINE HCL 25 MG PO TABS: 25.0000 mg | ORAL_TABLET | Freq: Four times a day (QID) | ORAL | Status: DC | PRN

## 2015-04-14 MED ORDER — METHYLPREDNISOLONE SODIUM SUCC 125 MG IJ SOLR
125.0000 mg | Freq: Once | INTRAMUSCULAR | Status: AC
  Administered 2015-04-14: 125 mg via INTRAVENOUS
  Filled 2015-04-14: qty 2

## 2015-04-14 MED ORDER — PREDNISONE 20 MG PO TABS: 40.0000 mg | ORAL_TABLET | Freq: Every day | ORAL | Status: DC

## 2015-04-14 MED ORDER — METHYLPREDNISOLONE 4 MG PO TBPK: ORAL_TABLET | ORAL | Status: DC

## 2015-04-14 MED ORDER — FAMOTIDINE 20 MG PO TABS: 20.0000 mg | ORAL_TABLET | Freq: Two times a day (BID) | ORAL | Status: DC

## 2015-04-14 MED ORDER — DIPHENHYDRAMINE HCL 50 MG/ML IJ SOLN
50.0000 mg | Freq: Once | INTRAMUSCULAR | Status: AC
  Administered 2015-04-14: 50 mg via INTRAVENOUS
  Filled 2015-04-14: qty 1

## 2015-04-14 NOTE — ED Notes (Signed)
MD at bedside. 

## 2015-04-14 NOTE — ED Provider Notes (Signed)
CSN: [id number]     Arrival date & time 04/14/15  1002 History  By signing my name below, I, Jordan Garrett, attest that this documentation has been prepared under the direction and in the presence of Eber HongBrian Yaret Hush, MD. Electronically Signed: Ronney LionSuzanne Garrett, ED Scribe. 04/14/2015. 10:54 AM.    Chief Complaint  Patient presents with  . Allergic Reaction   The history is provided by the patient and a significant other. No language interpreter was used.    HPI Comments: [name] is a 40 y.o. male who presents to the Emergency Department complaining of an improving, gradual-onset, constant, generalized rash that began this morning. He also had mild lip swelling and significant tongue swelling that has fortunately improved since onset, per girlfriend. Girlfriend states patient had taken 20 mg of children's liquid Benadryl with moderate improvement of his symptoms. Patient states he had a steak and cheese sandwich this morning. Patient states he had a similar reaction when he ate a steak and cheese 4 years ago. However, patient states he occasionally has eaten steak and cheese over the past 4 years without issue. He states he eats fried foods and peanut/peanut oil without issue. Patient is normally healthy, with no chronic medical conditions or regular medications. He denies change of soaps, lotions, shampoos, personal care products, clothing, or any other household products. He also denies any new pets or change in living situations. He denies abdominal pain or visual changes.     Past Medical History  Diagnosis Date  . ADD (attention deficit disorder)    History reviewed. No pertinent past surgical history. No family history on file. Social History  Substance Use Topics  . Smoking status: Current Every Day Smoker -- 1.00 packs/day for 15 years    Types: Cigarettes  . Smokeless tobacco: None  . Alcohol Use: No    Review of Systems  HENT: Positive for facial swelling (improved).   Eyes:  Negative for visual disturbance.  Gastrointestinal: Negative for abdominal pain.  Skin: Positive for rash.  All other systems reviewed and are negative.  Allergies  Review of patient's allergies indicates no known allergies.  Home Medications   Prior to Admission medications   Medication Sig Start Date End Date Taking? Authorizing Provider  amphetamine-dextroamphetamine (ADDERALL) 20 MG tablet Take 20 mg by mouth 2 (two) times daily.   Yes Historical Provider, MD  Menthol, Topical Analgesic, (ICY HOT EX) Apply 1 application topically 2 (two) times daily as needed (pain).   Yes Historical Provider, MD  amoxicillin (AMOXIL) 500 MG capsule Take 1 capsule (500 mg total) by mouth 3 (three) times daily. Patient not taking: Reported on 04/14/2015 03/15/15   Elson AreasLeslie K Sofia, PA-C  diphenhydrAMINE (BENADRYL) 25 MG tablet Take 1 tablet (25 mg total) by mouth every 6 (six) hours as needed for itching (Rash). 04/14/15   Eber HongBrian Katrena Stehlin, MD  EPINEPHrine (EPIPEN 2-PAK) 0.3 mg/0.3 mL IJ SOAJ injection Inject 0.3 mLs (0.3 mg total) into the muscle once as needed (for severe allergic reaction). CAll 911 immediately if you have to use this medicine 04/14/15   Eber HongBrian Hadar Elgersma, MD  famotidine (PEPCID) 20 MG tablet Take 1 tablet (20 mg total) by mouth 2 (two) times daily. 04/14/15   Eber HongBrian Jurgen Groeneveld, MD  predniSONE (DELTASONE) 20 MG tablet Take 2 tablets (40 mg total) by mouth daily. 04/14/15   Eber HongBrian Alora Gorey, MD   BP 134/95 mmHg  Pulse 75  Temp(Src) 97.4 F (36.3 C) (Oral)  Resp 16  Ht 5\' 10"  (  1.778 m)  Wt 165 lb (74.844 kg)  BMI 23.68 kg/m2  SpO2 99% Physical Exam  Constitutional: He appears well-developed and well-nourished. No distress.  HENT:  Head: Normocephalic and atraumatic.  Mouth/Throat: Oropharynx is clear and moist. No oropharyngeal exudate.  Oropharynx is clear, phonation is normal. Mild swelling of the lips and no swelling of the tongue.  Eyes: Conjunctivae and EOM are normal. Pupils are equal, round, and  reactive to light. Right eye exhibits no discharge. Left eye exhibits no discharge. No scleral icterus.  Neck: Normal range of motion. Neck supple. No JVD present. No thyromegaly present.  Cardiovascular: Normal rate, regular rhythm, normal heart sounds and intact distal pulses.  Exam reveals no gallop and no friction rub.   No murmur heard. Pulmonary/Chest: Effort normal and breath sounds normal. No respiratory distress. He has no wheezes. He has no rales.  Abdominal: Soft. Bowel sounds are normal. He exhibits no distension and no mass. There is no tenderness.  Musculoskeletal: Normal range of motion. He exhibits no edema or tenderness.  Lymphadenopathy:    He has no cervical adenopathy.  Neurological: He is alert. Coordination normal.  Skin: Skin is warm and dry. Rash noted. No erythema.  Diffuse urticaria to arms, trunk, neck.  Psychiatric: He has a normal mood and affect. His behavior is normal.  Nursing note and vitals reviewed.   ED Course  Procedures (including critical care time)  DIAGNOSTIC STUDIES: Oxygen Saturation is 100% on RA, normal by my interpretation.    COORDINATION OF CARE: 10:19 AM - Discussed treatment plan with pt at bedside which includes Benadryl every 6 hours as needed and Pepcid once a day. Will also give referral to PCP for allergy testing. Pt verbalized understanding and agreed to plan.   Labs Review Labs Reviewed - No data to display  Imaging Review No results found. I have personally reviewed and evaluated these images and lab results as part of my medical decision-making.    MDM   Final diagnoses:  Allergic reaction, initial encounter    I personally performed the services described in this documentation, which was scribed in my presence. The recorded information has been reviewed and is accurate.   The pt has improved signficantly No urticaria on recheck Lungs clear OP clear No swelling of lips or tongue Had long d/w pt re: need for epi  pen, how to use and indications for return He will get allergy testing in next 2 weeks PCP list given  Meds given in ED:  Medications  methylPREDNISolone sodium succinate (SOLU-MEDROL) 125 mg/2 mL injection 125 mg (125 mg Intravenous Given 04/14/15 1050)  diphenhydrAMINE (BENADRYL) injection 50 mg (50 mg Intravenous Given 04/14/15 1051)  famotidine (PEPCID) IVPB 20 mg premix (20 mg Intravenous New Bag/Given 04/14/15 1051)    New Prescriptions   DIPHENHYDRAMINE (BENADRYL) 25 MG TABLET    Take 1 tablet (25 mg total) by mouth every 6 (six) hours as needed for itching (Rash).   EPINEPHRINE (EPIPEN 2-PAK) 0.3 MG/0.3 ML IJ SOAJ INJECTION    Inject 0.3 mLs (0.3 mg total) into the muscle once as needed (for severe allergic reaction). CAll 911 immediately if you have to use this medicine   FAMOTIDINE (PEPCID) 20 MG TABLET    Take 1 tablet (20 mg total) by mouth 2 (two) times daily.   PREDNISONE (DELTASONE) 20 MG TABLET    Take 2 tablets (40 mg total) by mouth daily.      Eber Hong, MD 04/14/15 1309

## 2015-04-14 NOTE — ED Notes (Signed)
Pt states he woke up with rash over his entire body, neck and his abdomen primarily. Pt has no difficulties breathing. However, pt states that his lips are larger than normal. Pt took benadryl before coming to the hospital. NAD noted.

## 2015-04-14 NOTE — Discharge Instructions (Signed)
Please obtain all of your results from medical records or have your doctors office obtain the results - share them with your doctor - you should be seen at your doctors office in the next 2 days. Call today to arrange your follow up. Take the medications as prescribed. Please review all of the medicines and only take them if you do not have an allergy to them. Please be aware that if you are taking birth control pills, taking other prescriptions, ESPECIALLY ANTIBIOTICS may make the birth control ineffective - if this is the case, either do not engage in sexual activity or use alternative methods of birth control such as condoms until you have finished the medicine and your family doctor says it is OK to restart them. If you are on a blood thinner such as COUMADIN, be aware that any other medicine that you take may cause the coumadin to either work too much, or not enough - you should have your coumadin level rechecked in next 7 days if this is the case.  ?  It is also a possibility that you have an allergic reaction to any of the medicines that you have been prescribed - Everybody reacts differently to medications and while MOST people have no trouble with most medicines, you may have a reaction such as nausea, vomiting, rash, swelling, shortness of breath. If this is the case, please stop taking the medicine immediately and contact your physician.  ?  You should return to the ER if you develop severe or worsening symptoms.   If you have to give yourself an injection with the epi pen - you MUST come to the ER immediately.  Have your doctor do allergy testing this next 2 weeks.  [facility]    Kari BaarsEdward Hawkins MD. Specialty: Pulmonary Disease Contact information: [address]  [address]  [place] [address]  [phone]   Syliva OvermanMargaret Simpson, MD. Specialty: [facility] Contact information: [address]  [place] [address]  [phone]   Lilyan PuntScott  Luking, MD. Specialty: [facility] Contact information: [address]  [address]  [place] [address]  [phone]   Avon Gullyesfaye Fanta, MD Specialty: Internal Medicine Contact information: [address]  [place] [address]  [phone]   Catalina PizzaZach Hall, MD. Specialty: Internal Medicine Contact information: [address]  [place] [address]  [phone]   Butch PennyAngus Mcinnis, MD. Specialty: Family Medicine Contact information: [address]  [place] [address]  [phone]   John GiovanniStephen Knowlton, MD. Specialty: [facility] Contact information: [address]  [address]  [place] [address]  [phone]   Carylon Perchesoy Fagan, MD. Specialty: Internal Medicine Contact information: [address]  [address]  [place] [address]  [phone]

## 2015-07-12 ENCOUNTER — Encounter (HOSPITAL_COMMUNITY): Payer: Self-pay

## 2015-07-12 ENCOUNTER — Emergency Department (HOSPITAL_COMMUNITY)
Admission: EM | Admit: 2015-07-12 | Discharge: 2015-07-12 | Disposition: A | Discharge status: Home / Self Care | Payer: Self-pay | Attending: Emergency Medicine | Admitting: Emergency Medicine

## 2015-07-12 DIAGNOSIS — L0390 Cellulitis, unspecified: Secondary | ICD-10-CM

## 2015-07-12 DIAGNOSIS — Y929 Unspecified place or not applicable: Secondary | ICD-10-CM | POA: Insufficient documentation

## 2015-07-12 DIAGNOSIS — S70362A Insect bite (nonvenomous), left thigh, initial encounter: Secondary | ICD-10-CM | POA: Insufficient documentation

## 2015-07-12 DIAGNOSIS — S20461A Insect bite (nonvenomous) of right back wall of thorax, initial encounter: Secondary | ICD-10-CM | POA: Insufficient documentation

## 2015-07-12 DIAGNOSIS — L0291 Cutaneous abscess, unspecified: Secondary | ICD-10-CM

## 2015-07-12 DIAGNOSIS — Y999 Unspecified external cause status: Secondary | ICD-10-CM | POA: Insufficient documentation

## 2015-07-12 DIAGNOSIS — Z791 Long term (current) use of non-steroidal anti-inflammatories (NSAID): Secondary | ICD-10-CM | POA: Insufficient documentation

## 2015-07-12 DIAGNOSIS — Z79899 Other long term (current) drug therapy: Secondary | ICD-10-CM | POA: Insufficient documentation

## 2015-07-12 DIAGNOSIS — Y939 Activity, unspecified: Secondary | ICD-10-CM | POA: Insufficient documentation

## 2015-07-12 DIAGNOSIS — F17210 Nicotine dependence, cigarettes, uncomplicated: Secondary | ICD-10-CM | POA: Insufficient documentation

## 2015-07-12 DIAGNOSIS — W57XXXA Bitten or stung by nonvenomous insect and other nonvenomous arthropods, initial encounter: Secondary | ICD-10-CM | POA: Insufficient documentation

## 2015-07-12 MED ORDER — DOXYCYCLINE HYCLATE 100 MG PO TABS
100.0000 mg | ORAL_TABLET | Freq: Once | ORAL | Status: AC
  Administered 2015-07-12: 100 mg via ORAL
  Filled 2015-07-12: qty 1

## 2015-07-12 MED ORDER — DOXYCYCLINE HYCLATE 100 MG PO CAPS: 100.0000 mg | ORAL_CAPSULE | Freq: Two times a day (BID) | ORAL | Status: DC

## 2015-07-12 NOTE — ED Notes (Signed)
Pt states he had two ticks on him three days ago. States he pulled them off but he has had a headache and feeling sluggish since

## 2015-07-12 NOTE — ED Provider Notes (Signed)
CSN: [id number]     Arrival date & time 07/12/15  7829 History  By signing my name below, I, Iona Beard, attest that this documentation has been prepared under the direction and in the presence of Donnetta Hutching, MD.   Electronically Signed: Iona Beard, ED Scribe. 07/12/2015. 9:55 AM   Chief Complaint  Patient presents with  . Headache    The history is provided by the patient. No language interpreter was used.   HPI Comments: Jordan Garrett is a 40 y.o. male who presents to the Emergency Department complaining of gradual onset, headache, ongoing for three days. Pt reports several tick bites this week including one on his left anterior thigh, one on his suprapubic area, and one on his right back around T7. He also complains of associated fatigue. No other associated symptoms noted. No worsening or alleviating factors noted. Pt denies rash, neck pain, fever, chills, or any other pertinent symptoms.  Past Medical History  Diagnosis Date  . ADD (attention deficit disorder)    History reviewed. No pertinent past surgical history. No family history on file. Social History  Substance Use Topics  . Smoking status: Current Every Day Smoker -- 1.00 packs/day for 15 years    Types: Cigarettes  . Smokeless tobacco: None  . Alcohol Use: No    Review of Systems A complete 10 system review of systems was obtained and all systems are negative except as noted in the HPI and PMH.    Allergies  Review of patient's allergies indicates no known allergies.  Home Medications   Prior to Admission medications   Medication Sig Start Date End Date Taking? Authorizing Provider  famotidine (PEPCID) 20 MG tablet Take 1 tablet (20 mg total) by mouth 2 (two) times daily. 04/14/15  Yes Eber Hong, MD  ibuprofen (ADVIL,MOTRIN) 800 MG tablet Take 800 mg by mouth every 8 (eight) hours as needed for headache or moderate pain.   Yes Historical Provider, MD  amoxicillin (AMOXIL) 500 MG capsule Take 1  capsule (500 mg total) by mouth 3 (three) times daily. Patient not taking: Reported on 04/14/2015 03/15/15   Elson Areas, PA-C  amphetamine-dextroamphetamine (ADDERALL) 20 MG tablet Take 20 mg by mouth 2 (two) times daily. Reported on 07/12/2015    Historical Provider, MD  diphenhydrAMINE (BENADRYL) 25 MG tablet Take 1 tablet (25 mg total) by mouth every 6 (six) hours as needed for itching (Rash). Patient not taking: Reported on 07/12/2015 04/14/15   Eber Hong, MD  doxycycline (VIBRAMYCIN) 100 MG capsule Take 1 capsule (100 mg total) by mouth 2 (two) times daily. 07/12/15   Donnetta Hutching, MD  EPINEPHrine (EPIPEN 2-PAK) 0.3 mg/0.3 mL IJ SOAJ injection Inject 0.3 mLs (0.3 mg total) into the muscle once as needed (for severe allergic reaction). CAll 911 immediately if you have to use this medicine 04/14/15   Eber Hong, MD  Menthol, Topical Analgesic, (ICY HOT EX) Apply 1 application topically 2 (two) times daily as needed (pain). Reported on 07/12/2015    Historical Provider, MD  predniSONE (DELTASONE) 20 MG tablet Take 2 tablets (40 mg total) by mouth daily. Patient not taking: Reported on 07/12/2015 04/14/15   Eber Hong, MD   BP 147/92 mmHg  Pulse 74  Temp(Src) 98 F (36.7 C) (Oral)  Resp 18  Ht  (1.778 m)  Wt 162 lb (73.483 kg)  BMI 23.24 kg/m2  SpO2 100% Physical Exam  Constitutional: He is oriented to person, place, and time. He appears well-developed  and well-nourished.  HENT:  Head: Normocephalic and atraumatic.  Eyes: Conjunctivae and EOM are normal. Pupils are equal, round, and reactive to light.  Neck: Normal range of motion. Neck supple.  Cardiovascular: Normal rate and regular rhythm.   Pulmonary/Chest: Effort normal and breath sounds normal.  Abdominal: Soft. Bowel sounds are normal.  Musculoskeletal: Normal range of motion.  Neurological: He is alert and oriented to person, place, and time.  Skin: Skin is warm and dry. No rash noted.  Tick bites to patient's left  anterior thigh, suprapubic area, and on his right back around the T7 area. Each bite with an area of erythema approximately 2.5 cm in diameter and a central, crusted scabbing area.  Psychiatric: He has a normal mood and affect. His behavior is normal.  Nursing note and vitals reviewed.   ED Course  Procedures (including critical care time) DIAGNOSTIC STUDIES: Oxygen Saturation is 100% on RA, normal by my interpretation.    COORDINATION OF CARE: 8:57 AM Discussed treatment plan which includes doxycycline with pt at bedside and pt agreed to plan.  Labs Review Labs Reviewed - No data to display  Imaging Review No results found.   EKG Interpretation None      MDM   Final diagnoses:  Cellulitis and abscess   Patient has known tic exposure in 3 different sites. Each of these sites shows evidence of cellulitis/early abscess. Will Rx doxycycline 100 mg twice a day to cover for RMSF/Lyme along with coverage for MRSA   I personally performed the services described in this documentation, which was scribed in my presence. The recorded information has been reviewed and is accurate.       Donnetta HutchingBrian Arlin Savona, MD 07/12/15 [phone]

## 2015-07-12 NOTE — Discharge Instructions (Signed)
Keep wounds clean and dry. Recommend antibiotic ointment and cover with large Band-Aid. Prescription for antibiotic twice a day for 10 days. Tylenol or ibuprofen for headache or pain. Return if worse in anyway.

## 2015-09-07 ENCOUNTER — Emergency Department (HOSPITAL_COMMUNITY)
Admission: EM | Admit: 2015-09-07 | Discharge: 2015-09-07 | Disposition: A | Discharge status: Home / Self Care | Payer: Self-pay | Attending: Emergency Medicine | Admitting: Emergency Medicine

## 2015-09-07 ENCOUNTER — Encounter (HOSPITAL_COMMUNITY): Payer: Self-pay | Admitting: Emergency Medicine

## 2015-09-07 DIAGNOSIS — R21 Rash and other nonspecific skin eruption: Secondary | ICD-10-CM | POA: Insufficient documentation

## 2015-09-07 DIAGNOSIS — Z792 Long term (current) use of antibiotics: Secondary | ICD-10-CM | POA: Insufficient documentation

## 2015-09-07 DIAGNOSIS — Z79899 Other long term (current) drug therapy: Secondary | ICD-10-CM | POA: Insufficient documentation

## 2015-09-07 DIAGNOSIS — F17210 Nicotine dependence, cigarettes, uncomplicated: Secondary | ICD-10-CM | POA: Insufficient documentation

## 2015-09-07 DIAGNOSIS — Z791 Long term (current) use of non-steroidal anti-inflammatories (NSAID): Secondary | ICD-10-CM | POA: Insufficient documentation

## 2015-09-07 NOTE — ED Provider Notes (Signed)
[facility]-EMER[facility]ENCY DEPT Provider Note   CSN: [id number] Arrival date & time: 09/07/15  1745  First Provider Contact:  First MD Initiated Contact with Patient 09/07/15 1822        History   Chief Complaint Chief Complaint  Patient presents with  . Rash    HPI Jordan Garrett is a 40 y.o. male.  40 yo Caucasian male with no sig pmh presents to the ED today with rash and pruritis x 1 day. Patient states he works for the [employer] and was weed eating yesterday in tall grass. He got home from work and noticed small "bumps" on his abdomen. They have since erupted on his back, legs, torso, and groin. Patient states that the itching has worsened since yesterday. Nothing makes better or worse. He has tried hot baths without relief. Denies any medication treatment. Denies any fevers or pain. Patient states that he has had chiggers in the past and this looks like them. Patient has been itching and trying to "pop" the vesicles.  Patient denies any other symptoms or complaints.   The history is provided by the patient.  Rash      Past Medical History:  Diagnosis Date  . ADD (attention deficit disorder)     There are no active problems to display for this patient.   History reviewed. No pertinent surgical history.     Home Medications    Prior to Admission medications   Medication Sig Start Date End Date Taking? Authorizing Provider  amoxicillin (AMOXIL) 500 MG capsule Take 1 capsule (500 mg total) by mouth 3 (three) times daily. Patient not taking: Reported on 04/14/2015 03/15/15   Elson Areas, PA-C  amphetamine-dextroamphetamine (ADDERALL) 20 MG tablet Take 20 mg by mouth 2 (two) times daily. Reported on 07/12/2015    Historical Provider, MD  diphenhydrAMINE (BENADRYL) 25 MG tablet Take 1 tablet (25 mg total) by mouth every 6 (six) hours as needed for itching (Rash). Patient not taking: Reported on 07/12/2015 04/14/15   Eber Hong, MD  doxycycline (VIBRAMYCIN) 100 MG capsule Take 1  capsule (100 mg total) by mouth 2 (two) times daily. 07/12/15   Donnetta Hutching, MD  EPINEPHrine (EPIPEN 2-PAK) 0.3 mg/0.3 mL IJ SOAJ injection Inject 0.3 mLs (0.3 mg total) into the muscle once as needed (for severe allergic reaction). CAll 911 immediately if you have to use this medicine 04/14/15   Eber Hong, MD  famotidine (PEPCID) 20 MG tablet Take 1 tablet (20 mg total) by mouth 2 (two) times daily. 04/14/15   Eber Hong, MD  ibuprofen (ADVIL,MOTRIN) 800 MG tablet Take 800 mg by mouth every 8 (eight) hours as needed for headache or moderate pain.    Historical Provider, MD  Menthol, Topical Analgesic, (ICY HOT EX) Apply 1 application topically 2 (two) times daily as needed (pain). Reported on 07/12/2015    Historical Provider, MD  predniSONE (DELTASONE) 20 MG tablet Take 2 tablets (40 mg total) by mouth daily. Patient not taking: Reported on 07/12/2015 04/14/15   Eber Hong, MD    Family History No family history on file.  Social History Social History  Substance Use Topics  . Smoking status: Current Every Day Smoker    Packs/day: 1.00    Years: 15.00    Types: Cigarettes  . Smokeless tobacco: Never Used  . Alcohol use No     Allergies   Review of patient's allergies indicates no known allergies.   Review of Systems Review of Systems  Constitutional: Negative for  chills and fever.  HENT: Negative.   Eyes: Negative.   Respiratory: Negative for cough and shortness of breath.   Cardiovascular: Negative for chest pain and palpitations.  Gastrointestinal: Negative for abdominal pain, diarrhea, nausea and vomiting.  Genitourinary: Negative.   Skin: Positive for rash.  Neurological: Negative for headaches.  All other systems reviewed and are negative.    Physical Exam Updated Vital Signs BP 147/92 (BP Location: Left Arm)   Pulse 110   Temp 98.1 F (36.7 C) (Oral)   Resp 16   Ht 5\' 9"  (1.753 m)   Wt 74.4 kg   SpO2 100%   BMI 24.22 kg/m   Physical Exam    Constitutional: He appears well-developed and well-nourished. No distress.  HENT:  Head: Normocephalic and atraumatic.  Eyes: Conjunctivae are normal.  Neck: Normal range of motion. Neck supple.  Cardiovascular: Normal rate, regular rhythm, normal heart sounds and intact distal pulses.   Pulmonary/Chest: Effort normal and breath sounds normal.  Abdominal: Soft. Bowel sounds are normal.  Neurological: He is alert.  Skin: Skin is warm and dry. Capillary refill takes less than 2 seconds. Rash noted. There is erythema.  Erythematous, papulovesicular rash noted on entire back, torso, lower extremities bilaterally, inner thighs, groin. No rash noted to the face, penis or scrotum. Serosanguinous fluid noted on vesicle that patient admits to popping. No signs of infection noted. Patient actively itching. Small amount of dried blood noted in the navel from patient picking at vesicles.      ED Treatments / Results  Labs (all labs ordered are listed, but only abnormal results are displayed) Labs Reviewed - No data to display  EKG  EKG Interpretation None       Radiology No results found.  Procedures Procedures (including critical care time)  Medications Ordered in ED Medications - No data to display   Initial Impression / Assessment and Plan / ED Course  I have reviewed the triage vital signs and the nursing notes.  Pertinent labs & imaging results that were available during my care of the patient were reviewed by me and considered in my medical decision making (see chart for details).  Clinical Course  1822 Patient seen and examined by provider in the ED. Discharge instruction given.  MDM: Patient presents with generalized rash and pruritis for the past day. Patient with history of recent outdoor activity. Patient works for the [employer]. Rash likely due to chigger (mite) bites from working in tall grass. Patient has history of chiggers and states this is similar. Patient told to get  hydrocortisone cream or calamine lotion OTC and take benadryl at night for the itching. Patient verbalized understanding with plan of care. If symptoms worsen or starts to develop signs of infection told to return to the ED. Discussed plan of care with Dr. Hyacinth MeekerMiller.   Final Clinical Impressions(s) / ED Diagnoses   Final diagnoses:  Rash    New Prescriptions New Prescriptions   No medications on file     Rise MuKenneth T Lanyiah Brix, PA-C 09/08/15 [id number]    Eber HongBrian Miller, MD 09/08/15 1547

## 2015-09-07 NOTE — Discharge Instructions (Signed)
May try hydrocortisone cream or Calamine lotion to help with itching. Take benadryl at night to help with itching. Will resolve in a few days. If symptoms worsen please return to the ED.

## 2015-09-07 NOTE — ED Triage Notes (Signed)
Pt c/o itchy bumps that appeared to abdomen, torso, groin, and bilateral lower extremities. 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

## 2016-07-15 ENCOUNTER — Emergency Department (HOSPITAL_COMMUNITY)
Admission: EM | Admit: 2016-07-15 | Discharge: 2016-07-15 | Disposition: A | Discharge status: Home / Self Care | Payer: Self-pay | Attending: Emergency Medicine | Admitting: Emergency Medicine

## 2016-07-15 ENCOUNTER — Encounter (HOSPITAL_COMMUNITY): Payer: Self-pay

## 2016-07-15 DIAGNOSIS — Z79899 Other long term (current) drug therapy: Secondary | ICD-10-CM | POA: Insufficient documentation

## 2016-07-15 DIAGNOSIS — F17210 Nicotine dependence, cigarettes, uncomplicated: Secondary | ICD-10-CM | POA: Insufficient documentation

## 2016-07-15 DIAGNOSIS — K649 Unspecified hemorrhoids: Secondary | ICD-10-CM | POA: Insufficient documentation

## 2016-07-15 LAB — TYPE AND SCREEN
ABO/RH(D): O POS
ANTIBODY SCREEN: NEGATIVE

## 2016-07-15 LAB — COMPREHENSIVE METABOLIC PANEL
ALK PHOS: 55 U/L (ref 38–126)
ALT: 16 U/L — ABNORMAL LOW (ref 17–63)
ANION GAP: 5 (ref 5–15)
AST: 23 U/L (ref 15–41)
Albumin: 3.4 g/dL — ABNORMAL LOW (ref 3.5–5.0)
BUN: 6 mg/dL (ref 6–20)
CALCIUM: 8.6 mg/dL — AB (ref 8.9–10.3)
CO2: 26 mmol/L (ref 22–32)
Chloride: 111 mmol/L (ref 101–111)
Creatinine, Ser: 0.74 mg/dL (ref 0.61–1.24)
Glucose, Bld: 76 mg/dL (ref 65–99)
Potassium: 3.8 mmol/L (ref 3.5–5.1)
SODIUM: 142 mmol/L (ref 135–145)
TOTAL PROTEIN: 6.4 g/dL — AB (ref 6.5–8.1)
Total Bilirubin: 0.3 mg/dL (ref 0.3–1.2)

## 2016-07-15 LAB — CBC
HCT: 39.4 % (ref 39.0–52.0)
HEMOGLOBIN: 13 g/dL (ref 13.0–17.0)
MCH: 30.4 pg (ref 26.0–34.0)
MCHC: 33 g/dL (ref 30.0–36.0)
MCV: 92.1 fL (ref 78.0–100.0)
Platelets: 178 10*3/uL (ref 150–400)
RBC: 4.28 MIL/uL (ref 4.22–5.81)
RDW: 13.9 % (ref 11.5–15.5)
WBC: 7.2 10*3/uL (ref 4.0–10.5)

## 2016-07-15 LAB — ABO/RH: ABO/RH(D): O POS

## 2016-07-15 MED ORDER — AMOXICILLIN-POT CLAVULANATE 875-125 MG PO TABS: 1.0000 | ORAL_TABLET | Freq: Two times a day (BID) | ORAL | 0 refills | Status: DC

## 2016-07-15 MED ORDER — HYDROCORTISONE ACETATE 25 MG RE SUPP: 25.0000 mg | Freq: Two times a day (BID) | RECTAL | 0 refills | Status: DC

## 2016-07-15 NOTE — ED Triage Notes (Signed)
Pt reports he woke up this morning with blood in his underwear and states there is blood in his stool and in the toilet and on his toilet paper. This started yesterday. He reports he thinks it is hemorrhoids but has no hx of them. He states "the blood comes out without me even having a bowel movement."

## 2016-07-15 NOTE — ED Provider Notes (Signed)
[facility]-EMERGENCY DEPT Provider Note   CSN: [id number] Arrival date & time: 07/15/16  4098     History   Chief Complaint Chief Complaint  Patient presents with  . Rectal Bleeding    HPI Jordan Garrett is a 41 y.o. male.  He presents for evaluation of known hemorrhoids, which are bleeding at this time.  No trauma recently,no fever recently, no constipation recently.  He denies weakness, dizziness, nausea or vomiting.  No prior surgical hemorrhoid repair.  There are no other known modifying factors.  HPI  Past Medical History:  Diagnosis Date  . ADD (attention deficit disorder)     There are no active problems to display for this patient.   History reviewed. No pertinent surgical history.     Home Medications    Prior to Admission medications   Medication Sig Start Date End Date Taking? Authorizing Provider  amoxicillin (AMOXIL) 500 MG capsule Take 1 capsule (500 mg total) by mouth 3 (three) times daily. Patient not taking: Reported on 04/14/2015 03/15/15   Elson Areas, PA-C  amoxicillin-clavulanate (AUGMENTIN) 875-125 MG tablet Take 1 tablet by mouth 2 (two) times daily. One po bid x 7 days 07/15/16   Mancel Bale, MD  amphetamine-dextroamphetamine (ADDERALL) 20 MG tablet Take 20 mg by mouth 2 (two) times daily. Reported on 07/12/2015    [provider]  diphenhydrAMINE (BENADRYL) 25 MG tablet Take 1 tablet (25 mg total) by mouth every 6 (six) hours as needed for itching (Rash). Patient not taking: Reported on 07/12/2015 04/14/15   Eber Hong, MD  doxycycline (VIBRAMYCIN) 100 MG capsule Take 1 capsule (100 mg total) by mouth 2 (two) times daily. 07/12/15   Donnetta Hutching, MD  EPINEPHrine (EPIPEN 2-PAK) 0.3 mg/0.3 mL IJ SOAJ injection Inject 0.3 mLs (0.3 mg total) into the muscle once as needed (for severe allergic reaction). CAll 911 immediately if you have to use this medicine 04/14/15   Eber Hong, MD  famotidine (PEPCID) 20 MG tablet Take 1 tablet (20 mg  total) by mouth 2 (two) times daily. 04/14/15   Eber Hong, MD  hydrocortisone (ANUSOL-HC) 25 MG suppository Place 1 suppository (25 mg total) rectally 2 (two) times daily. For 7 days 07/15/16   Mancel Bale, MD  ibuprofen (ADVIL,MOTRIN) 800 MG tablet Take 800 mg by mouth every 8 (eight) hours as needed for headache or moderate pain.    [provider]  Menthol, Topical Analgesic, (ICY HOT EX) Apply 1 application topically 2 (two) times daily as needed (pain). Reported on 07/12/2015    [provider]  predniSONE (DELTASONE) 20 MG tablet Take 2 tablets (40 mg total) by mouth daily. Patient not taking: Reported on 07/12/2015 04/14/15   Eber Hong, MD    Family History No family history on file.  Social History Social History  Substance Use Topics  . Smoking status: Current Every Day Smoker    Packs/day: 1.00    Years: 15.00    Types: Cigarettes  . Smokeless tobacco: Never Used  . Alcohol use No     Allergies   Patient has no known allergies.   Review of Systems Review of Systems  All other systems reviewed and are negative.    Physical Exam Updated Vital Signs BP 129/87 (BP Location: Left Arm)   Pulse 74   Temp 98.1 F (36.7 C) (Oral)   Resp 18   SpO2 99%   Physical Exam  Constitutional: He is oriented to person, place, and time. He appears well-developed  and well-nourished.  HENT:  Head: Normocephalic and atraumatic.  Right Ear: External ear normal.  Left Ear: External ear normal.  Eyes: Conjunctivae and EOM are normal. Pupils are equal, round, and reactive to light.  Neck: Normal range of motion and phonation normal. Neck supple.  Cardiovascular: Normal rate.   Pulmonary/Chest: Effort normal. He exhibits no bony tenderness.  Genitourinary:  Genitourinary Comments: Bleeding external hemorrhoid left side, with purulent drainage.  No anal deformity.  Musculoskeletal: Normal range of motion.  Neurological: He is alert and oriented to person,  place, and time. No cranial nerve deficit or sensory deficit. He exhibits normal muscle tone. Coordination normal.  Skin: Skin is warm, dry and intact.  Psychiatric: He has a normal mood and affect. His behavior is normal. Judgment and thought content normal.  Nursing note and vitals reviewed.    ED Treatments / Results  Labs (all labs ordered are listed, but only abnormal results are displayed) Labs Reviewed  COMPREHENSIVE METABOLIC PANEL - Abnormal; Notable for the following:       Result Value   Calcium 8.6 (*)    Total Protein 6.4 (*)    Albumin 3.4 (*)    ALT 16 (*)    All other components within normal limits  CBC  POC OCCULT BLOOD, ED  TYPE AND SCREEN  ABO/RH    EKG  EKG Interpretation None       Radiology No results found.  Procedures Procedures (including critical care time)  Medications Ordered in ED Medications - No data to display   Initial Impression / Assessment and Plan / ED Course  I have reviewed the triage vital signs and the nursing notes.  Pertinent labs & imaging results that were available during my care of the patient were reviewed by me and considered in my medical decision making (see chart for details).      Patient Vitals for the past 24 hrs:  BP Temp Temp src Pulse Resp SpO2  07/15/16 0806 129/87 98.1 F (36.7 C) Oral 74 18 99 %      Final Clinical Impressions(s) / ED Diagnoses   Final diagnoses:  Bleeding hemorrhoid   Evaluation consistent with rectal bleeding secondary to hemorrhoid.  Hemorrhoid appears somewhat infected without evidence for perirectal abscess.  Doubt sepsis, or metabolic instability.  Nursing Notes Reviewed/ Care Coordinated Applicable Imaging Reviewed Interpretation of Laboratory Data incorporated into ED treatment  The patient appears reasonably screened and/or stabilized for discharge and I doubt any other medical condition or other [facility] requiring further screening, evaluation, or treatment in the ED  at this time prior to discharge.  Plan: Home Medications-ibuprofen as needed; Home Treatments-warm soaks and daily cleansings; return here if the recommended treatment, does not improve the symptoms; Recommended follow up-return here or see Dr. of choice for problems   New Prescriptions Discharge Medication List as of 07/15/2016  9:35 AM    START taking these medications   Details  amoxicillin-clavulanate (AUGMENTIN) 875-125 MG tablet Take 1 tablet by mouth 2 (two) times daily. One po bid x 7 days, Starting Wed 07/15/2016, Print    hydrocortisone (ANUSOL-HC) 25 MG suppository Place 1 suppository (25 mg total) rectally 2 (two) times daily. For 7 days, Starting Wed 07/15/2016, Print         Mancel BaleWentz, Vella Colquitt, MD 07/15/16 [phone]

## 2016-07-15 NOTE — ED Notes (Signed)
Pt states he wasn't constipated prior to this incident and endorses rectal bleeding intermittently without having a BM.

## 2016-07-15 NOTE — Discharge Instructions (Signed)
Soak in a warm tub 2 times a day for 30 minutes.  Clean the area of your hemorrhoids with soap and water and rinse well.  Use a moist towel at, such as the diaper white, for cleansing, after a bowel movement.  Take Colace 100 mg, stool softener, twice a day for 3 weeks, to keep the stool soft and help the hemorrhoids heal.  Use the prescriptions as directed, to treat the inflammation and infection.  Return here if needed for problems.

## 2016-07-15 NOTE — ED Notes (Signed)
Pt is in stable condition upon d/c and ambulates from ED. 

## 2016-10-03 ENCOUNTER — Emergency Department (HOSPITAL_COMMUNITY)
Admission: EM | Admit: 2016-10-03 | Discharge: 2016-10-03 | Disposition: A | Discharge status: Home / Self Care | Payer: Self-pay | Attending: Emergency Medicine | Admitting: Emergency Medicine

## 2016-10-03 ENCOUNTER — Encounter (HOSPITAL_COMMUNITY): Payer: Self-pay

## 2016-10-03 DIAGNOSIS — F17210 Nicotine dependence, cigarettes, uncomplicated: Secondary | ICD-10-CM | POA: Insufficient documentation

## 2016-10-03 DIAGNOSIS — K047 Periapical abscess without sinus: Secondary | ICD-10-CM | POA: Insufficient documentation

## 2016-10-03 MED ORDER — IBUPROFEN 600 MG PO TABS: 600.0000 mg | ORAL_TABLET | Freq: Four times a day (QID) | ORAL | 0 refills | Status: DC

## 2016-10-03 MED ORDER — AMOXICILLIN-POT CLAVULANATE 875-125 MG PO TABS: 1.0000 | ORAL_TABLET | Freq: Two times a day (BID) | ORAL | 0 refills | Status: DC

## 2016-10-03 NOTE — Discharge Instructions (Signed)
Please use salt water swishes 3 or 4 times daily. Use augmentin two times daily. It is important that you see a dentist as soon as possible.

## 2016-10-03 NOTE — ED Triage Notes (Signed)
Pt reports left upper dental pain. Pt has swelling upper lip, nose  and cheek. Noted to have poor dentition. Upper teeth missing and decayed. States swelling began yesterday

## 2016-10-03 NOTE — ED Provider Notes (Signed)
[facility]-EMERGENCY DEPT Provider Note   CSN: [id number] Arrival date & time: 10/03/16  1259     History   Chief Complaint Chief Complaint  Patient presents with  . Dental Pain    HPI Jordan Garrett is a 41 y.o. male.  Patient is a 41 year old male who presents to the emergency department with dental pain and abscess.  The patient states that he noticed the abscess on yesterday. He denies any fever or chills. He denies difficulty with swallowing. He denies any difficulty with his breathing. He does not have any medical history that should interfere with his immune system. The patient states he has had dental abscesses in the past. He has been advised to see a dentist, but has not been able to see a dentist up to this point.      Past Medical History:  Diagnosis Date  . ADD (attention deficit disorder)     There are no active problems to display for this patient.   History reviewed. No pertinent surgical history.     Home Medications    Prior to Admission medications   Medication Sig Start Date End Date Taking? Authorizing Provider  ibuprofen (ADVIL,MOTRIN) 200 MG tablet Take 800 mg by mouth every 6 (six) hours as needed.   Yes [provider]  amoxicillin-clavulanate (AUGMENTIN) 875-125 MG tablet Take 1 tablet by mouth 2 (two) times daily. One po bid x 7 days Patient not taking: Reported on 10/03/2016 07/15/16   Mancel Bale, MD  hydrocortisone (ANUSOL-HC) 25 MG suppository Place 1 suppository (25 mg total) rectally 2 (two) times daily. For 7 days Patient not taking: Reported on 10/03/2016 07/15/16   Mancel Bale, MD    Family History No family history on file.  Social History Social History  Substance Use Topics  . Smoking status: Current Every Day Smoker    Packs/day: 1.00    Years: 15.00    Types: Cigarettes  . Smokeless tobacco: Never Used  . Alcohol use No     Allergies   Patient has no known allergies.   Review of Systems Review of  Systems  Constitutional: Negative for activity change.       All ROS Neg except as noted in HPI  HENT: Positive for dental problem. Negative for nosebleeds.   Eyes: Negative for photophobia and discharge.  Respiratory: Negative for cough, shortness of breath and wheezing.   Cardiovascular: Negative for chest pain and palpitations.  Gastrointestinal: Negative for abdominal pain and blood in stool.  Genitourinary: Negative for dysuria, frequency and hematuria.  Musculoskeletal: Negative for arthralgias, back pain and neck pain.  Skin: Negative.   Neurological: Negative for dizziness, seizures and speech difficulty.  Psychiatric/Behavioral: Negative for confusion and hallucinations.     Physical Exam Updated Vital Signs BP (!) 166/88 (BP Location: Right Arm)   Pulse 74   Temp 98.3 F (36.8 C) (Oral)   Resp 20   Ht  (1.778 m)   Wt 73.5 kg (162 lb)   SpO2 100%   BMI 23.24 kg/m   Physical Exam  Constitutional: He is oriented to person, place, and time. He appears well-developed and well-nourished.  Non-toxic appearance.  HENT:  Head: Normocephalic.  Right Ear: Tympanic membrane and external ear normal.  Left Ear: Tympanic membrane and external ear normal.  Patient has an abscess of the gum above the canine at the left upper teeth. There are multiple dental caries present. Several of them are decayed to the gum line. The airway  is patent. There is no swelling under the tongue.  Eyes: Pupils are equal, round, and reactive to light. EOM and lids are normal.  Neck: Normal range of motion. Neck supple. Carotid bruit is not present.  Cardiovascular: Normal rate, regular rhythm, normal heart sounds, intact distal pulses and normal pulses.   No murmur heard. Pulmonary/Chest: Breath sounds normal. No respiratory distress.  Abdominal: Soft. Bowel sounds are normal. There is no tenderness. There is no guarding.  Musculoskeletal: Normal range of motion.  Lymphadenopathy:       Head  (right side): No submandibular adenopathy present.       Head (left side): No submandibular adenopathy present.    He has no cervical adenopathy.  Neurological: He is alert and oriented to person, place, and time. He has normal strength. No cranial nerve deficit or sensory deficit.  Skin: Skin is warm and dry.  Psychiatric: He has a normal mood and affect. His speech is normal.  Nursing note and vitals reviewed.    ED Treatments / Results  Labs (all labs ordered are listed, but only abnormal results are displayed) Labs Reviewed - No data to display  EKG  EKG Interpretation None       Radiology No results found.  Procedures Procedures (including critical care time)  Medications Ordered in ED Medications - No data to display   Initial Impression / Assessment and Plan / ED Course  I have reviewed the triage vital signs and the nursing notes.  Pertinent labs & imaging results that were available during my care of the patient were reviewed by me and considered in my medical decision making (see chart for details).       Final Clinical Impressions(s) / ED Diagnoses MDM Vital signs reviewed. Patient has an abscess of the upper gum area. It is currently draining. The airway is patent. There is no swelling under the tongue. No evidence for Ludwig's angina.  Patient will be treated with antibiotics and anti-inflammatory pain medication. Patient is strongly urged to see a dentist as soon as possible. The patient will return to the emergency department if any changes, problems, or concerns.     Final diagnoses:  Dental abscess    New Prescriptions New Prescriptions   AMOXICILLIN-CLAVULANATE (AUGMENTIN) 875-125 MG TABLET    Take 1 tablet by mouth every 12 (twelve) hours.   IBUPROFEN (ADVIL,MOTRIN) 600 MG TABLET    Take 1 tablet (600 mg total) by mouth 4 (four) times daily.     Ivery QualeBryant, Carrin Vannostrand, PA-C 10/03/16 1454    Lavera GuiseLiu, Dana Duo, MD 10/03/16 [phone]

## 2017-07-26 ENCOUNTER — Other Ambulatory Visit: Payer: Self-pay

## 2017-07-26 ENCOUNTER — Encounter (HOSPITAL_COMMUNITY): Payer: Self-pay | Admitting: *Deleted

## 2017-07-26 ENCOUNTER — Emergency Department (HOSPITAL_COMMUNITY)
Admission: EM | Admit: 2017-07-26 | Discharge: 2017-07-26 | Disposition: A | Discharge status: Home / Self Care | Payer: Self-pay | Attending: Emergency Medicine | Admitting: Emergency Medicine

## 2017-07-26 DIAGNOSIS — Z5321 Procedure and treatment not carried out due to patient leaving prior to being seen by health care provider: Secondary | ICD-10-CM | POA: Insufficient documentation

## 2017-07-26 DIAGNOSIS — K0889 Other specified disorders of teeth and supporting structures: Secondary | ICD-10-CM | POA: Insufficient documentation

## 2017-07-26 NOTE — ED Notes (Signed)
Pt stating he wants to leave; encouraged pt to stay and told him if he had any worsening of sx to come back to be seen

## 2017-07-26 NOTE — ED Triage Notes (Signed)
Pt c/o dental pain for the past few days that has gotten worse and also states that he just does not feel well,

## 2017-10-02 ENCOUNTER — Encounter (HOSPITAL_COMMUNITY): Payer: Self-pay | Admitting: Emergency Medicine

## 2017-10-02 ENCOUNTER — Emergency Department (HOSPITAL_COMMUNITY)
Admission: EM | Admit: 2017-10-02 | Discharge: 2017-10-02 | Disposition: A | Discharge status: Home / Self Care | Payer: Self-pay | Attending: Emergency Medicine | Admitting: Emergency Medicine

## 2017-10-02 ENCOUNTER — Other Ambulatory Visit: Payer: Self-pay

## 2017-10-02 DIAGNOSIS — Z79899 Other long term (current) drug therapy: Secondary | ICD-10-CM | POA: Insufficient documentation

## 2017-10-02 DIAGNOSIS — F909 Attention-deficit hyperactivity disorder, unspecified type: Secondary | ICD-10-CM | POA: Insufficient documentation

## 2017-10-02 DIAGNOSIS — F17210 Nicotine dependence, cigarettes, uncomplicated: Secondary | ICD-10-CM | POA: Insufficient documentation

## 2017-10-02 DIAGNOSIS — Y929 Unspecified place or not applicable: Secondary | ICD-10-CM | POA: Insufficient documentation

## 2017-10-02 DIAGNOSIS — Y999 Unspecified external cause status: Secondary | ICD-10-CM | POA: Insufficient documentation

## 2017-10-02 DIAGNOSIS — W57XXXA Bitten or stung by nonvenomous insect and other nonvenomous arthropods, initial encounter: Secondary | ICD-10-CM | POA: Insufficient documentation

## 2017-10-02 DIAGNOSIS — T07XXXA Unspecified multiple injuries, initial encounter: Secondary | ICD-10-CM | POA: Insufficient documentation

## 2017-10-02 DIAGNOSIS — Y939 Activity, unspecified: Secondary | ICD-10-CM | POA: Insufficient documentation

## 2017-10-02 MED ORDER — SULFAMETHOXAZOLE-TRIMETHOPRIM 800-160 MG PO TABS
1.0000 | ORAL_TABLET | Freq: Once | ORAL | Status: AC
  Administered 2017-10-02: 1 via ORAL
  Filled 2017-10-02: qty 1

## 2017-10-02 MED ORDER — PREDNISONE 10 MG PO TABS: ORAL_TABLET | ORAL | 0 refills | Status: DC

## 2017-10-02 MED ORDER — SULFAMETHOXAZOLE-TRIMETHOPRIM 800-160 MG PO TABS: 1.0000 | ORAL_TABLET | Freq: Two times a day (BID) | ORAL | 0 refills | Status: AC

## 2017-10-02 NOTE — Discharge Instructions (Addendum)
Try to avoid scratching at the affected areas.  Take over-the-counter Benadryl 1 capsule 25 mg, every 4-6 hours as needed for itching.  Is with mild soap and water.  Return here for any worsening symptoms.

## 2017-10-02 NOTE — ED Triage Notes (Addendum)
Patient c/o multiple possible chigger bites to thigh and groin that itches and burns. Per patient areas appeared on Wednesday. Per patient works outside. Denies any fevers.

## 2017-10-03 NOTE — ED Provider Notes (Signed)
[facility] EMERGENCY DEPARTMENT Provider Note   CSN: [id number] Arrival date & time: 10/02/17  1051     History   Chief Complaint Chief Complaint  Patient presents with  . Insect Bite    HPI RICHRD DA is a 42 y.o. male.  HPI  ARIANO INDA is a 42 y.o. male who presents to the Emergency Department complaining of multiple insect bites and itching.  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  He describes severe itching and scratching.  Reports bites the both legs, groin, and lower abdomen.  He denies fever, chills, swelling, and drainage.   Past Medical History:  Diagnosis Date  . ADD (attention deficit disorder)     There are no active problems to display for this patient.   No past surgical history on file.    Home Medications    Prior to Admission medications   Medication Sig Start Date End Date Taking? Authorizing Provider  amoxicillin-clavulanate (AUGMENTIN) 875-125 MG tablet Take 1 tablet by mouth every 12 (twelve) hours. 10/03/16   Ivery Quale, PA-C  hydrocortisone (ANUSOL-HC) 25 MG suppository Place 1 suppository (25 mg total) rectally 2 (two) times daily. For 7 days Patient not taking: Reported on 10/03/2016 07/15/16   Mancel Bale, MD  ibuprofen (ADVIL,MOTRIN) 600 MG tablet Take 1 tablet (600 mg total) by mouth 4 (four) times daily. 10/03/16   Ivery Quale, PA-C  predniSONE (DELTASONE) 10 MG tablet Take 6 tablets day one, 5 tablets day two, 4 tablets day three, 3 tablets day four, 2 tablets day five, then 1 tablet day six 10/02/17   Marg Macmaster, PA-C  sulfamethoxazole-trimethoprim (BACTRIM DS,SEPTRA DS) 800-160 MG tablet Take 1 tablet by mouth 2 (two) times daily for 7 days. 10/02/17 10/09/17  Pauline Aus, PA-C    Family History No family history on file.  Social History Social History   Tobacco Use  . Smoking status: Current Every Day Smoker    Packs/day: 1.00    Years: 15.00    Pack years: 15.00    Types:  Cigarettes  . Smokeless tobacco: Never Used  Substance Use Topics  . Alcohol use: No  . Drug use: No     Allergies   Patient has no known allergies.   Review of Systems Review of Systems  Constitutional: Negative for activity change, appetite change, chills and fever.  HENT: Negative for facial swelling, sore throat and trouble swallowing.   Respiratory: Negative for chest tightness, shortness of breath and wheezing.   Gastrointestinal: Negative for abdominal pain, nausea and vomiting.  Musculoskeletal: Negative for arthralgias, neck pain and neck stiffness.  Skin: Positive for rash. Negative for wound.  Neurological: Negative for dizziness, weakness, numbness and headaches.     Physical Exam Updated Vital Signs BP (!) 150/85 (BP Location: Right Arm)   Pulse 87   Temp 98.2 F (36.8 C) (Oral)   Resp 18   Ht 5\' 9"  (1.753 m)   Wt 74.8 kg   SpO2 97%   BMI 24.37 kg/m   Physical Exam  Constitutional: He appears well-developed and well-nourished. No distress.  HENT:  Head: Normocephalic and atraumatic.  Mouth/Throat: Uvula is midline and oropharynx is clear and moist. No oral lesions. No uvula swelling.  Neck: Normal range of motion. Neck supple.  Cardiovascular: Normal rate, regular rhythm and intact distal pulses.  No murmur heard. Pulmonary/Chest: Effort normal and breath sounds normal. No respiratory distress.  Abdominal: Soft. There is no tenderness.  Musculoskeletal: He exhibits no edema or tenderness.  Lymphadenopathy:    He has no cervical adenopathy.  Neurological: He is alert. No sensory deficit. He exhibits normal muscle tone.  Skin: Skin is warm. Capillary refill takes less than 2 seconds. Rash noted. There is erythema.  Multiple, scattered Erythematous papules to the bilateral legs, groin, abdomen.  Multiple excoriations and few lesions have surrounding erythema.  No drainage or edema  Nursing note and vitals reviewed.    ED Treatments / Results   Labs (all labs ordered are listed, but only abnormal results are displayed) Labs Reviewed - No data to display  EKG None  Radiology No results found.  Procedures Procedures (including critical care time)  Medications Ordered in ED Medications  sulfamethoxazole-trimethoprim (BACTRIM DS,SEPTRA DS) 800-160 MG per tablet 1 tablet (1 tablet Oral Given 10/02/17 1123)     Initial Impression / Assessment and Plan / ED Course  I have reviewed the triage vital signs and the nursing notes.  Pertinent labs & imaging results that were available during my care of the patient were reviewed by me and considered in my medical decision making (see chart for details).     Pt with multiple insect bites, some with possible early secondary infection likely from scratching.  Pt otherwise well appearing. Vitals reviewed.  Appropriate for d/c. Pt agrees to tx plan.     Final Clinical Impressions(s) / ED Diagnoses   Final diagnoses:  Bug bite with infection, initial encounter    ED Discharge Orders         Ordered    sulfamethoxazole-trimethoprim (BACTRIM DS,SEPTRA DS) 800-160 MG tablet  2 times daily     10/02/17 1118    predniSONE (DELTASONE) 10 MG tablet     10/02/17 1118           Mikayah Joy, PA-C 10/03/17 2229    Samuel Jester, DO 10/05/17 1008

## 2017-11-06 ENCOUNTER — Encounter (HOSPITAL_COMMUNITY): Payer: Self-pay | Admitting: Emergency Medicine

## 2017-11-06 ENCOUNTER — Emergency Department (HOSPITAL_COMMUNITY)
Admission: EM | Admit: 2017-11-06 | Discharge: 2017-11-06 | Disposition: A | Discharge status: Home / Self Care | Payer: Self-pay | Attending: Emergency Medicine | Admitting: Emergency Medicine

## 2017-11-06 ENCOUNTER — Other Ambulatory Visit: Payer: Self-pay

## 2017-11-06 DIAGNOSIS — Z5321 Procedure and treatment not carried out due to patient leaving prior to being seen by health care provider: Secondary | ICD-10-CM | POA: Insufficient documentation

## 2017-11-06 DIAGNOSIS — M25512 Pain in left shoulder: Secondary | ICD-10-CM | POA: Insufficient documentation

## 2017-11-06 NOTE — ED Notes (Signed)
KS in to assess 

## 2017-11-06 NOTE — ED Triage Notes (Signed)
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 For the past week has been having L shoulder pain that improves with icy hot application.

## 2017-11-06 NOTE — ED Notes (Signed)
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

## 2017-11-06 NOTE — ED Notes (Signed)
Pt states he wants to leave because his pain is now gone and he needs to pick up his daughter by 1700. Pt encouraged to stay and be seen by EDP. EDP notified that pt wants to leave. Pt signed AMA paperwork and left.

## 2017-11-08 ENCOUNTER — Encounter (HOSPITAL_COMMUNITY): Payer: Self-pay | Admitting: Emergency Medicine

## 2017-11-08 ENCOUNTER — Emergency Department (HOSPITAL_COMMUNITY): Payer: Self-pay

## 2017-11-08 ENCOUNTER — Other Ambulatory Visit: Payer: Self-pay

## 2017-11-08 ENCOUNTER — Emergency Department (HOSPITAL_COMMUNITY)
Admission: EM | Admit: 2017-11-08 | Discharge: 2017-11-08 | Disposition: A | Discharge status: Home / Self Care | Payer: Self-pay | Attending: Emergency Medicine | Admitting: Emergency Medicine

## 2017-11-08 DIAGNOSIS — R079 Chest pain, unspecified: Secondary | ICD-10-CM | POA: Insufficient documentation

## 2017-11-08 DIAGNOSIS — F17210 Nicotine dependence, cigarettes, uncomplicated: Secondary | ICD-10-CM | POA: Insufficient documentation

## 2017-11-08 LAB — BASIC METABOLIC PANEL
ANION GAP: 9 (ref 5–15)
BUN: 14 mg/dL (ref 6–20)
CALCIUM: 9 mg/dL (ref 8.9–10.3)
CO2: 26 mmol/L (ref 22–32)
CREATININE: 0.77 mg/dL (ref 0.61–1.24)
Chloride: 104 mmol/L (ref 98–111)
GFR calc Af Amer: >60 mL/min (ref >60)
GFR calc non Af Amer: >60 mL/min (ref >60)
Glucose, Bld: 96 mg/dL (ref 70–99)
Potassium: 3.8 mmol/L (ref 3.5–5.1)
SODIUM: 139 mmol/L (ref 135–145)

## 2017-11-08 LAB — CBC
HCT: 42.3 % (ref 39.0–52.0)
Hemoglobin: 13.9 g/dL (ref 13.0–17.0)
MCH: 30.4 pg (ref 26.0–34.0)
MCHC: 32.9 g/dL (ref 30.0–36.0)
MCV: 92.6 fL (ref 80.0–100.0)
NRBC: 0 % (ref 0.0–0.2)
PLATELETS: 221 10*3/uL (ref 150–400)
RBC: 4.57 MIL/uL (ref 4.22–5.81)
RDW: 13.4 % (ref 11.5–15.5)
WBC: 9.5 10*3/uL (ref 4.0–10.5)

## 2017-11-08 LAB — I-STAT TROPONIN, ED
TROPONIN I, POC: 0 ng/mL (ref 0.00–0.08)
TROPONIN I, POC: 0 ng/mL (ref 0.00–0.08)

## 2017-11-08 NOTE — ED Notes (Signed)
Patient transported to X-ray 

## 2017-11-08 NOTE — Discharge Instructions (Signed)
Your cardiac tests today looked great. Follow-up with your primary care doctor. Return here for any new/acute changes.

## 2017-11-08 NOTE — ED Notes (Signed)
ED Provider at bedside. 
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
## 2017-11-08 NOTE — ED Triage Notes (Signed)
Pt reports intermittent CP since last Sunday. Pt reports the pain became constant tonight. Pt reports sharp L CP radiating L arm. Pt reports feeling tingling to L arm. Pt took 325 mg of ASA prior to arrival. Pt denies N/V, dizziness, lightheadedness. He reports some SHOB. Denies cardiac history

## 2017-12-03 ENCOUNTER — Emergency Department (HOSPITAL_COMMUNITY)
Admission: EM | Admit: 2017-12-03 | Discharge: 2017-12-03 | Disposition: A | Discharge status: Home / Self Care | Payer: Self-pay | Attending: Emergency Medicine | Admitting: Emergency Medicine

## 2017-12-03 ENCOUNTER — Encounter (HOSPITAL_COMMUNITY): Payer: Self-pay | Admitting: Emergency Medicine

## 2017-12-03 ENCOUNTER — Other Ambulatory Visit: Payer: Self-pay

## 2017-12-03 DIAGNOSIS — F17210 Nicotine dependence, cigarettes, uncomplicated: Secondary | ICD-10-CM | POA: Insufficient documentation

## 2017-12-03 DIAGNOSIS — K029 Dental caries, unspecified: Secondary | ICD-10-CM | POA: Insufficient documentation

## 2017-12-03 MED ORDER — PENICILLIN V POTASSIUM 500 MG PO TABS: 500.0000 mg | ORAL_TABLET | Freq: Four times a day (QID) | ORAL | 0 refills | Status: DC

## 2017-12-03 MED ORDER — IBUPROFEN 400 MG PO TABS
400.0000 mg | ORAL_TABLET | Freq: Once | ORAL | Status: AC
  Administered 2017-12-03: 400 mg via ORAL
  Filled 2017-12-03: qty 1

## 2017-12-03 MED ORDER — PENICILLIN V POTASSIUM 250 MG PO TABS
500.0000 mg | ORAL_TABLET | Freq: Once | ORAL | Status: AC
  Administered 2017-12-03: 500 mg via ORAL
  Filled 2017-12-03: qty 2

## 2017-12-03 NOTE — ED Provider Notes (Signed)
  [facility] EMERGENCY DEPARTMENT Provider Note   CSN: [id number] Arrival date & time: 12/03/17  2259     History   Chief Complaint Chief Complaint  Patient presents with  . Dental Pain    HPI Jordan Garrett is a 42 y.o. male.  The history is provided by the patient.  Dental Pain   This is a new problem. The current episode started 2 days ago. The problem occurs constantly. The problem has been gradually worsening. The pain is moderate. He has tried nothing for the symptoms.    Past Medical History:  Diagnosis Date  . ADD (attention deficit disorder)     There are no active problems to display for this patient.   History reviewed. No pertinent surgical history.      Home Medications    Prior to Admission medications   Medication Sig Start Date End Date Taking? Authorizing Provider  penicillin v potassium (VEETID) 500 MG tablet Take 1 tablet (500 mg total) by mouth 4 (four) times daily. 12/03/17   Zadie Rhine, MD    Family History No family history on file.  Social History Social History   Tobacco Use  . Smoking status: Current Every Day Smoker    Packs/day: 1.50    Years: 15.00    Pack years: 22.50    Types: Cigarettes  . Smokeless tobacco: Never Used  Substance Use Topics  . Alcohol use: No  . Drug use: No     Allergies   Patient has no known allergies.   Review of Systems Review of Systems  Constitutional: Negative for fever.  Gastrointestinal: Negative for vomiting.     Physical Exam Updated Vital Signs BP (!) 174/114 (BP Location: Right Arm) Comment: Checked x2  Pulse 78   Temp 98.2 F (36.8 C) (Oral)   Resp 18   Ht 1.753 m (5\' 9" )   Wt 74.8 kg   SpO2 99%   BMI 24.37 kg/m   Physical Exam CONSTITUTIONAL: Well developed/well nourished HEAD AND FACE: Normocephalic/atraumatic EYES: EOMI/PERRL ENMT: Mucous membranes moist.  Poor dentition.  No trismus.  No focal abscess noted.  Diffuse tenderness to upper incisors,  multiple decayed teeth NECK: supple no meningeal signs CV: S1/S2 noted, no murmurs/rubs/gallops noted LUNGS: Lungs are clear to auscultation bilaterally, no apparent distress ABDOMEN: soft, nontender, no rebound or guarding NEURO: Pt is awake/alert, moves all extremitiesx4 EXTREMITIES:full ROM SKIN: warm, color normal   ED Treatments / Results  Labs (all labs ordered are listed, but only abnormal results are displayed) Labs Reviewed - No data to display  EKG None  Radiology No results found.  Procedures Procedures (including critical care time)  Medications Ordered in ED Medications  penicillin v potassium (VEETID) tablet 500 mg (500 mg Oral Given 12/03/17 2331)  ibuprofen (ADVIL,MOTRIN) tablet 400 mg (400 mg Oral Given 12/03/17 2331)     Initial Impression / Assessment and Plan / ED Course  I have reviewed the triage vital signs and the nursing notes.     He is driving, he is unable to receive pain medications.  Referred to dentistry  Final Clinical Impressions(s) / ED Diagnoses   Final diagnoses:  Dental caries    ED Discharge Orders         Ordered    penicillin v potassium (VEETID) 500 MG tablet  4 times daily     12/03/17 2324           Zadie Rhine, MD 12/03/17 2336

## 2017-12-03 NOTE — ED Triage Notes (Signed)
Pt with R upper dental pain that started yesterday.

## 2017-12-12 ENCOUNTER — Emergency Department (HOSPITAL_COMMUNITY)
Admission: EM | Admit: 2017-12-12 | Discharge: 2017-12-12 | Disposition: A | Discharge status: Home / Self Care | Payer: Self-pay | Attending: Emergency Medicine | Admitting: Emergency Medicine

## 2017-12-12 ENCOUNTER — Other Ambulatory Visit: Payer: Self-pay

## 2017-12-12 ENCOUNTER — Encounter (HOSPITAL_COMMUNITY): Payer: Self-pay | Admitting: Emergency Medicine

## 2017-12-12 DIAGNOSIS — F17210 Nicotine dependence, cigarettes, uncomplicated: Secondary | ICD-10-CM | POA: Insufficient documentation

## 2017-12-12 DIAGNOSIS — K120 Recurrent oral aphthae: Secondary | ICD-10-CM | POA: Insufficient documentation

## 2017-12-12 MED ORDER — LIDOCAINE HCL URETHRAL/MUCOSAL 2 % EX GEL
1.0000 "application " | Freq: Once | CUTANEOUS | Status: AC
  Administered 2017-12-12: 1 via TOPICAL
  Filled 2017-12-12: qty 10

## 2017-12-12 NOTE — ED Triage Notes (Signed)
Pt states that he has a sore on the inside of his upper lip

## 2017-12-12 NOTE — ED Provider Notes (Signed)
[facility] EMERGENCY DEPARTMENT Provider Note   CSN: [id number] Arrival date & time: 12/12/17  0820     History   Chief Complaint Chief Complaint  Patient presents with  . Dental Pain    HPI Providence LaniusBilly J Coury is a 42 y.o. male with chronic advanced generalized dental decay who was recently treated with a 10-day course of penicillin presenting with a tender lesion at his upper lip mucosa.  He states this lesion was present when he was started on the penicillin 9 days ago but the lesion has worsened.  It is painful, burning and worse with certain foods.  There is been no drainage from the site.  He denies a history of similar symptoms.  He is a smoker.  He denies fevers or chills, throat pain, shortness of breath.  He has had no medications for this problem prior to arrival.  The history is provided by the patient.    Past Medical History:  Diagnosis Date  . ADD (attention deficit disorder)     There are no active problems to display for this patient.   History reviewed. No pertinent surgical history.      Home Medications    Prior to Admission medications   Medication Sig Start Date End Date Taking? Authorizing Provider  ibuprofen (ADVIL,MOTRIN) 200 MG tablet Take 800 mg by mouth every 6 (six) hours as needed.   Yes [provider]  penicillin v potassium (VEETID) 500 MG tablet Take 1 tablet (500 mg total) by mouth 4 (four) times daily. 12/03/17  Yes Zadie RhineWickline, Donald, MD    Family History History reviewed. No pertinent family history.  Social History Social History   Tobacco Use  . Smoking status: Current Every Day Smoker    Packs/day: 1.50    Years: 15.00    Pack years: 22.50    Types: Cigarettes  . Smokeless tobacco: Never Used  Substance Use Topics  . Alcohol use: No  . Drug use: No     Allergies   Patient has no known allergies.   Review of Systems Review of Systems  Constitutional: Negative for fever.  HENT: Positive for dental problem  and mouth sores. Negative for facial swelling and sore throat.   Respiratory: Negative for shortness of breath.   Musculoskeletal: Negative for neck pain and neck stiffness.     Physical Exam Updated Vital Signs BP (!) 152/102 (BP Location: Left Arm)   Pulse 79   Temp 97.7 F (36.5 C) (Oral)   Resp 10   Ht 5\' 9"  (1.753 m)   Wt 73.9 kg   SpO2 99%   BMI 24.07 kg/m   Physical Exam  Constitutional: He is oriented to person, place, and time. He appears well-developed and well-nourished. No distress.  HENT:  Head: Normocephalic and atraumatic.  Right Ear: Tympanic membrane and external ear normal.  Left Ear: Tympanic membrane and external ear normal.  Mouth/Throat: Oropharynx is clear and moist. Oral lesions present. No trismus in the jaw. Dental caries present. No uvula swelling.  Patient with generalized advanced dental decay with poor dental hygiene.  His superior frontal incisors have old fractures, these teeth are broken to near the gumline, black in appearance.  There is a large lesion of his lip mucosa adjacent to these teeth.  Appearance of an aphthous ulcer.   Eyes: Conjunctivae are normal.  Neck: Normal range of motion. Neck supple.  Cardiovascular: Normal rate and normal heart sounds.  Pulmonary/Chest: Effort normal.  Abdominal: He exhibits no  distension.  Musculoskeletal: Normal range of motion.  Lymphadenopathy:    He has no cervical adenopathy.  Neurological: He is alert and oriented to person, place, and time.  Skin: Skin is warm and dry. No erythema.  Psychiatric: He has a normal mood and affect.     ED Treatments / Results  Labs (all labs ordered are listed, but only abnormal results are displayed) Labs Reviewed - No data to display  EKG None  Radiology No results found.  Procedures Procedures (including critical care time)  Medications Ordered in ED Medications  lidocaine (XYLOCAINE) 2 % jelly 1 application (1 application Topical Given 12/12/17  0956)     Initial Impression / Assessment and Plan / ED Course  I have reviewed the triage vital signs and the nursing notes.  Pertinent labs & imaging results that were available during my care of the patient were reviewed by me and considered in my medical decision making (see chart for details).     Patient was strongly advised that he needs to obtain a PCP and he was given referrals to local clinics.  He is also advised that he needs follow-up care with the dentist, referrals also given.  Discussed that if this lesion has not resolved over the next 10 days he needs to have it rechecked.  Discussed that he has an increased risk for oral cancer given he is a smoker.  His exam today does not suggest that possibility, however he may need biopsy of the site if it does not heal as expected.  He was given topical lidocaine for symptom relief.  Advised to avoid foods that trigger pain.  Referrals given.  Final Clinical Impressions(s) / ED Diagnoses   Final diagnoses:  Aphthous ulcer of mouth    ED Discharge Orders    None       Victoriano Lain 12/12/17 1008    Raeford Razor, MD 12/12/17 [phone]

## 2017-12-12 NOTE — Discharge Instructions (Signed)
Use a small quantity of the gel given for pain relief, especially prior to eating meals.  Avoid salty, acidic foods which will increase the pain.  You need to have this rechecked if it does not resolve - a dentist would be the best choice for this.  See the referrals.  Also, a few suggestions for medical clinics that are accessible without insurance includes [facility] and the [facility]

## 2017-12-19 ENCOUNTER — Emergency Department (HOSPITAL_COMMUNITY)
Admission: EM | Admit: 2017-12-19 | Discharge: 2017-12-19 | Disposition: A | Discharge status: Home / Self Care | Payer: Self-pay | Attending: Emergency Medicine | Admitting: Emergency Medicine

## 2017-12-19 ENCOUNTER — Other Ambulatory Visit: Payer: Self-pay

## 2017-12-19 DIAGNOSIS — K0889 Other specified disorders of teeth and supporting structures: Secondary | ICD-10-CM | POA: Insufficient documentation

## 2017-12-19 DIAGNOSIS — Z79899 Other long term (current) drug therapy: Secondary | ICD-10-CM | POA: Insufficient documentation

## 2017-12-19 DIAGNOSIS — F17210 Nicotine dependence, cigarettes, uncomplicated: Secondary | ICD-10-CM | POA: Insufficient documentation

## 2017-12-19 MED ORDER — LIDOCAINE HCL URETHRAL/MUCOSAL 2 % EX GEL
1.0000 "application " | Freq: Once | CUTANEOUS | Status: AC
  Administered 2017-12-19: 1 via TOPICAL
  Filled 2017-12-19: qty 20

## 2017-12-19 NOTE — Discharge Instructions (Signed)
Follow-up with a dentist.  Only a dentist can fix your problem.  Take ibuprofen as prescribed for pain control.  Use topical lidocaine jelly as needed.  Return to the ED for any new or concerning symptoms.

## 2017-12-19 NOTE — ED Triage Notes (Signed)
Pt complaining of dental pain. [facility] and received lidocaine and an antibiotic upon discharge. Pt said he has since ran out of lidocaine and returned to the ED for a refill of it because he "can not afford a dentist right now". Pt stated he did complete full dose of antibiotic.

## 2017-12-19 NOTE — ED Notes (Signed)
Patient given lidocaine. Discharge instructions reviewed with patient. All questions answered. Patient ambulated to vehicle with belongings

## 2017-12-19 NOTE — ED Provider Notes (Signed)
[facility] EMERGENCY DEPARTMENT Provider Note   CSN: [id number] Arrival date & time: 12/19/17  0007     History   Chief Complaint Chief Complaint  Patient presents with  . Dental Pain    HPI Jordan Garrett is a 42 y.o. male.  42 year old male presents to the emergency department for persistent dentalgia.  States that his symptoms have been improving, overall.  Completed a course of antibiotics and has been using topical lidocaine jelly.  Ran out of the lidocaine recently, and return to the emergency department today requesting a refill.  He states that he cannot afford to see a dentist at this time as he works out of town during the week.  Denies any purulent drainage in the mouth, fevers.  Denies any symptomatic worsening since last seen.  Actually reports that he is currently able to eat and drink without pain or difficulty.  Has tried Orajel for symptoms, but believes this makes his discomfort worse.  The history is provided by the patient. No language interpreter was used.  Dental Pain      Past Medical History:  Diagnosis Date  . ADD (attention deficit disorder)     There are no active problems to display for this patient.   No past surgical history on file.     Home Medications    Prior to Admission medications   Medication Sig Start Date End Date Taking? Authorizing Provider  ibuprofen (ADVIL,MOTRIN) 200 MG tablet Take 800 mg by mouth every 6 (six) hours as needed.    [provider]  penicillin v potassium (VEETID) 500 MG tablet Take 1 tablet (500 mg total) by mouth 4 (four) times daily. 12/03/17   Zadie Rhine, MD    Family History No family history on file.  Social History Social History   Tobacco Use  . Smoking status: Current Every Day Smoker    Packs/day: 1.50    Years: 15.00    Pack years: 22.50    Types: Cigarettes  . Smokeless tobacco: Never Used  Substance Use Topics  . Alcohol use: No  . Drug use: No      Allergies   Patient has no known allergies.   Review of Systems Review of Systems Ten systems reviewed and are negative for acute change, except as noted in the HPI.    Physical Exam Updated Vital Signs BP (!) 163/108 (BP Location: Right Arm)   Pulse (!) 105   Temp 98.5 F (36.9 C) (Oral)   Resp 18   Ht 5\' 9"  (1.753 m)   Wt 73.9 kg   SpO2 99%   BMI 24.07 kg/m   Physical Exam  Constitutional: He is oriented to person, place, and time. He appears well-developed and well-nourished. No distress.  Nontoxic appearing and in NAD  HENT:  Head: Normocephalic and atraumatic.  Mouth/Throat: Oropharynx is clear and moist. Abnormal dentition. Dental caries present. No dental abscesses.  Multiple decayed teeth, worse to the upper teeth including the central and lateral incisors as well as b/l upper canines. No distinct ulcer noted though upper gingiva and inner lip with areas of mild friability. No trismus.  Eyes: Conjunctivae and EOM are normal. No scleral icterus.  Neck: Normal range of motion.  No meningismus  Pulmonary/Chest: Effort normal. No respiratory distress.  Respirations even and unlabored  Musculoskeletal: Normal range of motion.  Neurological: He is alert and oriented to person, place, and time. He exhibits normal muscle tone. Coordination normal.  Skin: Skin  is warm and dry. No rash noted. He is not diaphoretic. No erythema. No pallor.  Psychiatric: He has a normal mood and affect. His behavior is normal.  Nursing note and vitals reviewed.    ED Treatments / Results  Labs (all labs ordered are listed, but only abnormal results are displayed) Labs Reviewed - No data to display  EKG None  Radiology No results found.  Procedures Procedures (including critical care time)  Medications Ordered in ED Medications  lidocaine (XYLOCAINE) 2 % jelly 1 application (has no administration in time range)     Initial Impression / Assessment and Plan / ED Course   I have reviewed the triage vital signs and the nursing notes.  Pertinent labs & imaging results that were available during my care of the patient were reviewed by me and considered in my medical decision making (see chart for details).     Patient with improving dentalgia in the setting of gross dental decay.  Returns requesting new applicator of lidocaine jelly as this has helped his pain since evaluation 1 week ago.  No gross abscess.  Exam unconcerning for Ludwig's angina or spread of infection.  Urged patient to follow-up with dentist.  Return precautions discussed and provided. Patient discharged in stable condition with no unaddressed concerns.   Final Clinical Impressions(s) / ED Diagnoses   [facility]    ED Discharge Orders    None       Antony MaduraHumes, Koralyn Prestage, PA-C 12/19/17 0039    Ward, Layla MawKristen N, DO 12/19/17 0104

## 2018-02-19 ENCOUNTER — Encounter (HOSPITAL_COMMUNITY): Payer: Self-pay | Admitting: Emergency Medicine

## 2018-02-19 ENCOUNTER — Emergency Department (HOSPITAL_COMMUNITY)
Admission: EM | Admit: 2018-02-19 | Discharge: 2018-02-19 | Disposition: A | Discharge status: Home / Self Care | Payer: Self-pay | Attending: Emergency Medicine | Admitting: Emergency Medicine

## 2018-02-19 ENCOUNTER — Other Ambulatory Visit: Payer: Self-pay

## 2018-02-19 DIAGNOSIS — Z5321 Procedure and treatment not carried out due to patient leaving prior to being seen by health care provider: Secondary | ICD-10-CM | POA: Insufficient documentation

## 2018-02-19 DIAGNOSIS — R509 Fever, unspecified: Secondary | ICD-10-CM | POA: Insufficient documentation

## 2018-02-19 MED ORDER — ACETAMINOPHEN 325 MG PO TABS: 650.0000 mg | ORAL_TABLET | Freq: Once | ORAL | Status: DC | PRN

## 2018-02-19 NOTE — ED Notes (Signed)
After triage pt states he does not wish to wait at this time d/t flu restrictions and child care. Advised pt we would see pt, but states he would like to go back home at this time. Will return if needed per pt.

## 2018-02-19 NOTE — ED Triage Notes (Signed)
Pt c/o cough, congestion, and fever for 2 days. Denies known sick contact. Dayquil at 1700.

## 2018-02-24 ENCOUNTER — Emergency Department (HOSPITAL_COMMUNITY)
Admission: EM | Admit: 2018-02-24 | Discharge: 2018-02-24 | Disposition: A | Discharge status: Home / Self Care | Payer: Self-pay

## 2018-02-24 ENCOUNTER — Emergency Department (HOSPITAL_COMMUNITY): Payer: Self-pay

## 2018-02-24 ENCOUNTER — Emergency Department (HOSPITAL_COMMUNITY)
Admission: EM | Admit: 2018-02-24 | Discharge: 2018-02-24 | Disposition: A | Discharge status: Home / Self Care | Payer: Self-pay | Attending: Emergency Medicine | Admitting: Emergency Medicine

## 2018-02-24 ENCOUNTER — Encounter (HOSPITAL_COMMUNITY): Payer: Self-pay

## 2018-02-24 DIAGNOSIS — R059 Cough, unspecified: Secondary | ICD-10-CM

## 2018-02-24 DIAGNOSIS — R05 Cough: Secondary | ICD-10-CM

## 2018-02-24 DIAGNOSIS — J189 Pneumonia, unspecified organism: Secondary | ICD-10-CM | POA: Insufficient documentation

## 2018-02-24 DIAGNOSIS — F17210 Nicotine dependence, cigarettes, uncomplicated: Secondary | ICD-10-CM | POA: Insufficient documentation

## 2018-02-24 DIAGNOSIS — J181 Lobar pneumonia, unspecified organism: Secondary | ICD-10-CM

## 2018-02-24 LAB — CBC WITH DIFFERENTIAL/PLATELET
Abs Immature Granulocytes: 0.03 10*3/uL (ref 0.00–0.07)
BASOS ABS: 0.1 10*3/uL (ref 0.0–0.1)
Basophils Relative: 1 %
EOS ABS: 0.6 10*3/uL — AB (ref 0.0–0.5)
EOS PCT: 6 %
HEMATOCRIT: 39.9 % (ref 39.0–52.0)
Hemoglobin: 13.2 g/dL (ref 13.0–17.0)
Immature Granulocytes: 0 %
LYMPHS ABS: 2.5 10*3/uL (ref 0.7–4.0)
Lymphocytes Relative: 29 %
MCH: 29.5 pg (ref 26.0–34.0)
MCHC: 33.1 g/dL (ref 30.0–36.0)
MCV: 89.3 fL (ref 80.0–100.0)
MONO ABS: 1 10*3/uL (ref 0.1–1.0)
Monocytes Relative: 12 %
NRBC: 0 % (ref 0.0–0.2)
Neutro Abs: 4.6 10*3/uL (ref 1.7–7.7)
Neutrophils Relative %: 52 %
Platelets: 218 10*3/uL (ref 150–400)
RBC: 4.47 MIL/uL (ref 4.22–5.81)
RDW: 13.2 % (ref 11.5–15.5)
WBC: 8.8 10*3/uL (ref 4.0–10.5)

## 2018-02-24 LAB — BASIC METABOLIC PANEL
Anion gap: 11 (ref 5–15)
BUN: 9 mg/dL (ref 6–20)
CALCIUM: 8.9 mg/dL (ref 8.9–10.3)
CHLORIDE: 105 mmol/L (ref 98–111)
CO2: 21 mmol/L — AB (ref 22–32)
CREATININE: 0.71 mg/dL (ref 0.61–1.24)
GFR calc non Af Amer: >60 mL/min (ref >60)
GLUCOSE: 94 mg/dL (ref 70–99)
Potassium: 3.6 mmol/L (ref 3.5–5.1)
Sodium: 137 mmol/L (ref 135–145)

## 2018-02-24 MED ORDER — AMOXICILLIN 500 MG PO CAPS: 1000.0000 mg | ORAL_CAPSULE | Freq: Three times a day (TID) | ORAL | 0 refills | Status: AC

## 2018-02-24 MED ORDER — DOXYCYCLINE HYCLATE 100 MG PO CAPS: 100.0000 mg | ORAL_CAPSULE | Freq: Two times a day (BID) | ORAL | 0 refills | Status: AC

## 2018-02-24 NOTE — ED Triage Notes (Signed)
PT arrives POV for eval of persistent cough x 2 weeks. Pt was dx'd w/ flu at [facility] last week, states "flu is better, but this cough is worse" Endorses productive cough, states "trying to make sure I just don't have pneumonia".

## 2018-02-24 NOTE — ED Provider Notes (Signed)
[facility] EMERGENCY DEPARTMENT Provider Note   CSN: [id number] Arrival date & time: 02/24/18  1751     History   Chief Complaint Chief Complaint  Patient presents with  . Cough    HPI Jordan Garrett is a 43 y.o. male presenting with a productive cough onset 2 weeks ago. Patient reports he was diagnosed with influenza 1 week ago and symptoms improved except for the cough.  Patient states he was not tested for the flu, but was diagnosed clinically. Patient did not take tamiflu. Patient states cough has worsened over the last few days. Patient states he had a fever 2 days ago, but states fever resolved. Patient states he has been taking ibuprofen, dayquil, and Nyquil with relief. Patient states nothing makes symptoms worse. Patient denies any sick contacts. Patient denies shortness of breath, chest pain, congestion, abdominal pain, nausea, vomiting, diarrhea, body aches, or headache.   HPI  Past Medical History:  Diagnosis Date  . ADD (attention deficit disorder)     There are no active problems to display for this patient.   History reviewed. No pertinent surgical history.      Home Medications    Prior to Admission medications   Medication Sig Start Date End Date Taking? Authorizing Provider  amoxicillin (AMOXIL) 500 MG capsule Take 2 capsules (1,000 mg total) by mouth 3 (three) times daily for 5 days. 02/24/18 03/01/18  Carlyle Basques P, PA-C  doxycycline (VIBRAMYCIN) 100 MG capsule Take 1 capsule (100 mg total) by mouth 2 (two) times daily for 5 days. 02/24/18 03/01/18  Carlyle Basques P, PA-C  ibuprofen (ADVIL,MOTRIN) 200 MG tablet Take 800 mg by mouth every 6 (six) hours as needed.    [provider]  penicillin v potassium (VEETID) 500 MG tablet Take 1 tablet (500 mg total) by mouth 4 (four) times daily. 12/03/17   Zadie Rhine, MD    Family History History reviewed. No pertinent family history.  Social History Social History   Tobacco  Use  . Smoking status: Current Every Day Smoker    Packs/day: 1.50    Years: 15.00    Pack years: 22.50    Types: Cigarettes  . Smokeless tobacco: Never Used  Substance Use Topics  . Alcohol use: No  . Drug use: No     Allergies   Patient has no known allergies.   Review of Systems Review of Systems  Constitutional: Positive for fever. Negative for activity change, appetite change and fatigue.  HENT: Negative for congestion, ear pain, postnasal drip, rhinorrhea and sore throat.   Eyes: Negative for pain, redness and itching.  Respiratory: Positive for cough. Negative for shortness of breath.   Cardiovascular: Negative for chest pain.  Gastrointestinal: Negative for abdominal pain, diarrhea, nausea and vomiting.  Musculoskeletal: Negative for myalgias.  Skin: Negative for rash.  Allergic/Immunologic: Negative for environmental allergies and immunocompromised state.  Neurological: Negative for dizziness, weakness and headaches.     Physical Exam Updated Vital Signs BP (!) 142/98   Pulse 92   Temp 98.3 F (36.8 C) (Oral)   Resp 16   SpO2 99%   Physical Exam Vitals signs and nursing note reviewed.  Constitutional:      General: He is not in acute distress.    Appearance: He is well-developed. He is not diaphoretic.  HENT:     Head: Normocephalic and atraumatic.     Right Ear: Tympanic membrane, ear canal and external ear normal.     Left  Ear: Tympanic membrane, ear canal and external ear normal.     Nose: Congestion and rhinorrhea present.     Mouth/Throat:     Mouth: Mucous membranes are moist.     Pharynx: No oropharyngeal exudate or posterior oropharyngeal erythema.  Eyes:     Conjunctiva/sclera: Conjunctivae normal.  Neck:     Musculoskeletal: Normal range of motion and neck supple.     Vascular: No JVD.  Cardiovascular:     Rate and Rhythm: Normal rate and regular rhythm.     Pulses: Normal pulses.          Radial pulses are 2+ on the right side and 2+  on the left side.       Dorsalis pedis pulses are 2+ on the right side and 2+ on the left side.     Heart sounds: Normal heart sounds. No murmur. No friction rub. No gallop.   Pulmonary:     Effort: Pulmonary effort is normal. No respiratory distress.     Breath sounds: Examination of the left-middle field reveals rales. Examination of the left-lower field reveals rales. Rales present. No wheezing or rhonchi.  Chest:     Chest wall: No tenderness.  Abdominal:     Palpations: Abdomen is soft.     Tenderness: There is no abdominal tenderness.  Musculoskeletal: Normal range of motion.  Skin:    General: Skin is warm.     Capillary Refill: Capillary refill takes less than 2 seconds.     Coloration: Skin is not pale.     Findings: No rash.  Neurological:     Mental Status: He is alert and oriented to person, place, and time.      ED Treatments / Results  Labs (all labs ordered are listed, but only abnormal results are displayed) Labs Reviewed  CBC WITH DIFFERENTIAL/PLATELET - Abnormal; Notable for the following components:      Result Value   Eosinophils Absolute 0.6 (*)    All other components within normal limits  BASIC METABOLIC PANEL - Abnormal; Notable for the following components:   CO2 21 (*)    All other components within normal limits    EKG None  Radiology Dg Chest 2 View  Result Date: 02/24/2018 CLINICAL DATA:  Initial evaluation for acute cough. EXAM: CHEST - 2 VIEW COMPARISON:  Prior radiograph from 11/08/2017 FINDINGS: Cardiac and mediastinal silhouettes stable in size and contour, and remain within normal limits. Lungs normally inflated. Hazy left lower lobe infiltrate, concerning for pneumonia. Perihilar vascular congestion without overt pulmonary edema. Probable trace left pleural effusion. No pneumothorax. No acute osseous finding. Metallic BB overlies the lower right neck, stable. IMPRESSION: Hazy left lower lobe infiltrate, concerning for pneumonia.  Electronically Signed   By: Rise MuBenjamin  McClintock M.D.   On: 02/24/2018 18:36    Procedures Procedures (including critical care time)  Medications Ordered in ED Medications - No data to display   Initial Impression / Assessment and Plan / ED Course  I have reviewed the triage vital signs and the nursing notes.  Pertinent labs & imaging results that were available during my care of the patient were reviewed by me and considered in my medical decision making (see chart for details).  Clinical Course as of Feb 25 2107  Thu Feb 24, 2018  1913 Hazy left lower lobe infiltrate, concerning for pneumonia.  DG Chest 2 View [AH]    Clinical Course User Index [AH] Leretha DykesHernandez, Freddrick Gladson P, PA-C   Suspect symptoms are  likely due to community acquired pneumonia due to history, physical exam, and CXR. CXR reveals left lower lobe infiltrate. Patient is currently stable and is in no acute distress. Will prescribe antibiotics for outpatient treatment. Discussed strict return precautions with patient. Advised patient to follow up with PCP in 2 days. Discussed plan with patient and patient states he understands and agrees with plan.   Final Clinical Impressions(s) / ED Diagnoses   Final diagnoses:  Cough  Community acquired pneumonia of left lower lobe of lung [facility](HCC)    ED Discharge Orders         Ordered    amoxicillin (AMOXIL) 500 MG capsule  3 times daily     02/24/18 2106    doxycycline (VIBRAMYCIN) 100 MG capsule  2 times daily     02/24/18 2106           Leretha DykesHernandez, Zeyad Delaguila P, [place] JerseyPA-C 02/24/18 2109    Linwood DibblesKnapp, Jon, MD 02/25/18 [phone]

## 2018-02-24 NOTE — Discharge Instructions (Addendum)
You have been seen today for cough. Please read and follow all provided instructions.   1. Medications: amoxicillin, doxycycline, usual home medications 2. Treatment: rest, drink plenty of fluids 3. Follow Up: Please follow up with your primary doctor in 2 days for discussion of your diagnoses and further evaluation after today's visit; if you do not have a primary care doctor use the resource guide provided to find one; Please return to the ER for any new or worsening symptoms. Please obtain all of your results from medical records or have your doctors office obtain the results - share them with your doctor - you should be seen at your doctors office. Call today to arrange your follow up.   Take medications as prescribed. Please review all of the medicines and only take them if you do not have an allergy to them. Return to the emergency room for worsening condition or new concerning symptoms. Follow up with your regular doctor. If you don't have a regular doctor use one of the numbers below to establish a primary care doctor.  Please be aware that if you are taking birth control pills, taking other prescriptions, ESPECIALLY ANTIBIOTICS may make the birth control ineffective - if this is the case, either do not engage in sexual activity or use alternative methods of birth control such as condoms until you have finished the medicine and your family doctor says it is OK to restart them. If you are on a blood thinner such as COUMADIN, be aware that any other medicine that you take may cause the coumadin to either work too much, or not enough - you should have your coumadin level rechecked in next 7 days if this is the case.  ?  It is also a possibility that you have an allergic reaction to any of the medicines that you have been prescribed - Everybody reacts differently to medications and while MOST people have no trouble with most medicines, you may have a reaction such as nausea, vomiting, rash, swelling,  shortness of breath. If this is the case, please stop taking the medicine immediately and contact your physician.  ?  You should return to the ER if you develop severe or worsening symptoms.   Emergency Department Resource Guide 1) Find a Doctor and Pay Out of Pocket Although you won't have to find out who is covered by your insurance plan, it is a good idea to ask around and get recommendations. You will then need to call the office and see if the doctor you have chosen will accept you as a new patient and what types of options they offer for patients who are self-pay. Some doctors offer discounts or will set up payment plans for their patients who do not have insurance, but you will need to ask so you aren't surprised when you get to your appointment.  2) Contact Your Local Health Department Not all health departments have doctors that can see patients for sick visits, but many do, so it is worth a call to see if yours does. If you don't know where your local health department is, you can check in your phone book. The CDC also has a tool to help you locate your state's health department, and many state websites also have listings of all of their local health departments.  3) Find a Walk-in Clinic If your illness is not likely to be very severe or complicated, you may want to try a walk in clinic. These are popping up all  over the country in pharmacies, drugstores, and shopping centers. They're usually staffed by nurse practitioners or physician assistants that have been trained to treat common illnesses and complaints. They're usually fairly quick and inexpensive. However, if you have serious medical issues or chronic medical problems, these are probably not your best option.  No Primary Care Doctor: Call Health Connect at  [phone] - they can help you locate a primary care doctor that  accepts your insurance, provides certain services, etc. Physician Referral [phone]  Emergency  Department Resource Guide 1) Find a Doctor and Pay Out of Pocket Although you won't have to find out who is covered by your insurance plan, it is a good idea to ask around and get recommendations. You will then need to call the office and see if the doctor you have chosen will accept you as a new patient and what types of options they offer for patients who are self-pay. Some doctors offer discounts or will set up payment plans for their patients who do not have insurance, but you will need to ask so you aren't surprised when you get to your appointment.  2) Contact Your Local Health Department Not all health departments have doctors that can see patients for sick visits, but many do, so it is worth a call to see if yours does. If you don't know where your local health department is, you can check in your phone book. The CDC also has a tool to help you locate your state's health department, and many state websites also have listings of all of their local health departments.  3) Find a [facility] If your illness is not likely to be very severe or complicated, you may want to try a walk in clinic. These are popping up all over the country in pharmacies, drugstores, and shopping centers. They're usually staffed by nurse practitioners or physician assistants that have been trained to treat common illnesses and complaints. They're usually fairly quick and inexpensive. However, if you have serious medical issues or chronic medical problems, these are probably not your best option.  No Primary Care Doctor: Call Health Connect at  [phone] - they can help you locate a primary care doctor that  accepts your insurance, provides certain services, etc. Physician Referral Service- [phone]  Chronic Pain Problems: Organization         Address  Phone   Notes  [facility]  [phone] Patients need to be referred by their primary care doctor.   Medication  Assistance: Organization         Address  Phone   Notes  [facility] Medication [facility] [address] [place], [place] [address] [phone] --Must be a resident of [facility] -- Must have NO insurance coverage whatsoever (no Medicaid/ Medicare, etc.) -- The pt. MUST have a primary care doctor that directs their care regularly and follows them in the community   [facility]  [phone]   [employer]  [phone]    Agencies that provide inexpensive medical care: Organization         Address  Phone   Notes  [place]  [phone]   [facility]    [phone]   [facility] [place], [place] [address] [phone]   [place] [address], [place] [phone]   [facility]    [phone]  [facility]    [phone]   [facility]  [address], [place] Phone:  [phone], Fax:  [phone] Hours of Operation:  9 am - 6 pm, M-F.  Also accepts Medicaid/Medicare and self-pay.  [facility] for [address], [address], [place] Phone: [phone], Fax: [phone]. Hours of Operation:  8:30 am - 5:30 pm, M-F.  Also accepts Medicaid and self-pay.  [facility] [place] [address], [place] Phone: [phone]   [place], [place], [place] [phone], [phone] Mondays & Thursdays: 7-9 AM.  First 15 patients are seen on a first come, first serve basis.    [place] Providers:  Organization         Address  Phone   Notes  [facility] [address], [place] [phone] Also accepts self-pay patients.  [facility] [address], [place]  [phone]   [place], [address], [place]  [phone]   [facility] [address] [place] [phone]   Lucianne Lei [address], [place]   [phone] Only accepts [place] [facility] [place] patients after they have their name applied to their card.   Self-Pay (no insurance) in [facility]:  Organization         Address  Phone   Notes  Sickle Cell Patients, [facility] [phone]   [facility] [place] [phone]   [facility] [place]  [place], [place], [place] [phone]   [facility]/Dr. Osei-Bonsu  [address] [place] or [address], [place] [phone] Phone number for both [place] and [place] locations is the same.  [facility] and [facility] [address], [place] [phone]   [facility] [address], [place] or [address] [phone]   [facility] [address], [place] [phone], phone; [phone], fax Sees patients 1st and 3rd Saturday of every month.  Must not qualify for public or private insurance (i.e. Medicaid, Medicare, [place] Health Choice, Veterans' Benefits)  Household income should be no more than 200% of the poverty level The clinic cannot treat you if you are pregnant or think you are pregnant  Sexually transmitted diseases are not treated at the clinic.

## 2018-02-24 NOTE — ED Notes (Signed)
Patient verbalizes understanding of discharge instructions. Opportunity for questioning and answers were provided. Armband removed by staff, pt discharged from ED home via POV.  

## 2018-05-25 ENCOUNTER — Other Ambulatory Visit: Payer: Self-pay

## 2018-05-25 ENCOUNTER — Emergency Department (HOSPITAL_COMMUNITY)
Admission: EM | Admit: 2018-05-25 | Discharge: 2018-05-25 | Disposition: A | Discharge status: Home / Self Care | Payer: Self-pay | Attending: Emergency Medicine | Admitting: Emergency Medicine

## 2018-05-25 ENCOUNTER — Encounter (HOSPITAL_COMMUNITY): Payer: Self-pay

## 2018-05-25 DIAGNOSIS — J029 Acute pharyngitis, unspecified: Secondary | ICD-10-CM | POA: Insufficient documentation

## 2018-05-25 DIAGNOSIS — F17210 Nicotine dependence, cigarettes, uncomplicated: Secondary | ICD-10-CM | POA: Insufficient documentation

## 2018-05-25 DIAGNOSIS — R0789 Other chest pain: Secondary | ICD-10-CM | POA: Insufficient documentation

## 2018-05-25 NOTE — ED Triage Notes (Signed)
Pt states that he just wanted to get check out "with all this stuff going on." Pt states that he wants xray b/c he has had PNA in past and he is worried that side pain cld be something serious.

## 2018-05-25 NOTE — ED Provider Notes (Signed)
[facility]   CSN: [id number] Arrival date & time: 05/25/18  0746    History   Chief Complaint Chief Complaint  Patient presents with  . Muscle Pain    upper torso under LA    HPI Jordan Garrett is a 43 y.o. male w PMHx ADD, presenting to the ED requesting to be "checked out." Pt states he is worried regarding the current COVID-19 pandemic.  He states his uncle recently had a GI bug, however was not diagnosed with COVID-19.  He states for the past few days he has been feeling "off."  He states yesterday he developed a mild sore throat while at work.  He also developed a little bit of sided lower lateral rib pain that is intermittent.  He states it feels like a pulled muscle.  He does do heavy lifting for work.  He states sometimes he has runny nose with itchy eyes, however does not treat with allergy medications.  He also endorses a little bit of diarrhea that is nonbloody.  No abdominal pain.  He denies new cough, fever, pain with breathing.  No history of DVT/PE, unilateral leg swelling, recent surgery or travel, history of cancer.  He states his work instructed him to report here for a work note to return back because he was complaining of the pain.  Last ED visit in January, patient was diagnosed with pneumonia.  He states this does not feel similar.     The history is provided by the patient.    Past Medical History:  Diagnosis Date  . ADD (attention deficit disorder)     There are no active problems to display for this patient.   History reviewed. No pertinent surgical history.      Home Medications    Prior to Admission medications   Medication Sig Start Date End Date Taking? Authorizing Provider  ibuprofen (ADVIL,MOTRIN) 200 MG tablet Take 800 mg by mouth every 6 (six) hours as needed.    [provider]  penicillin v potassium (VEETID) 500 MG tablet Take 1 tablet (500 mg total) by mouth 4 (four) times  daily. 12/03/17   Zadie RhineWickline, Donald, MD    Family History History reviewed. No pertinent family history.  Social History Social History   Tobacco Use  . Smoking status: Current Every Day Smoker    Packs/day: 1.50    Years: 15.00    Pack years: 22.50    Types: Cigarettes  . Smokeless tobacco: Never Used  Substance Use Topics  . Alcohol use: No  . Drug use: No     Allergies   Patient has no known allergies.   Review of Systems Review of Systems  All other systems reviewed and are negative.    Physical Exam Updated Vital Signs BP (!) 144/95 (BP Location: Right Arm)   Pulse 89   Temp 98.8 F (37.1 C) (Oral)   Ht 5\' 9"  (1.753 m)   Wt 74.8 kg   SpO2 99%   BMI 24.37 kg/m   Physical Exam Vitals signs and nursing note reviewed.  Constitutional:      General: He is not in acute distress.    Appearance: He is well-developed. He is not ill-appearing.  HENT:     Head: Normocephalic and atraumatic.     Right Ear: Tympanic membrane and ear canal normal.     Left Ear: Tympanic membrane and ear canal normal.     Mouth/Throat:  Mouth: Mucous membranes are moist.     Pharynx: Oropharynx is clear. No oropharyngeal exudate or posterior oropharyngeal erythema.  Eyes:     Conjunctiva/sclera: Conjunctivae normal.  Neck:     Musculoskeletal: Normal range of motion and neck supple. No neck rigidity.  Cardiovascular:     Rate and Rhythm: Normal rate and regular rhythm.     Heart sounds: Normal heart sounds.  Pulmonary:     Effort: Pulmonary effort is normal. No respiratory distress.     Breath sounds: Normal breath sounds.  Chest:     Chest wall: No tenderness.  Abdominal:     General: Abdomen is flat. Bowel sounds are normal. There is no distension.     Palpations: Abdomen is soft.     Tenderness: There is no abdominal tenderness. There is no guarding or rebound.  Lymphadenopathy:     Cervical: No cervical adenopathy.  Neurological:     Mental Status: He is alert.   Psychiatric:        Mood and Affect: Mood normal.        Behavior: Behavior normal.      ED Treatments / Results  Labs (all labs ordered are listed, but only abnormal results are displayed) Labs Reviewed - No data to display  EKG None  Radiology No results found.  Procedures Procedures (including critical care time)  Medications Ordered in ED Medications - No data to display   Initial Impression / Assessment and Plan / ED Course  I have reviewed the triage vital signs and the nursing notes.  Pertinent labs & imaging results that were available during my care of the patient were reviewed by me and considered in my medical decision making (see chart for details).        Patient presenting with request for a checkup with worry regarding the current COVID-19 pandemic.  He also presents requesting a work note to return back to work.  He has having some left sided lower chest wall pain that is intermittent.  He does endorse heavy lifting at work, and states it feels like a pulled muscle. Agree, seems to be likely musculoskeletal in nature.  He does not have any respiratory complaints, including no cough, pleuritic chest pain, fever.  He states he has a mild sore throat, runny nose and itchy watery eyes, however has not treated with allergy medications.  On exam, vital signs are stable, afebrile, normal work of breathing.  O2 saturation 99% on room air.  He is not in distress.  Lungs are clear bilaterally.  ENT exam is unremarkable.  Had shared decision making with patient regarding chest x-ray.  Unlikely ACS given presentation.  PERC negative.  At this time, patient agreed to treat symptomatically and return for any concerning symptoms including shortness of breath or severely worsening chest pain.  Safe for discharge without complaints.  Discussed results, findings, treatment and follow up. Patient advised of return precautions. Patient verbalized understanding and agreed with plan.   Final Clinical Impressions(s) / ED Diagnoses   Final diagnoses:  Left-sided chest wall pain    ED Discharge Orders    None       Lyssa Hackley, [place] N, PA-C 05/25/18 0034    Jacalyn Lefevre, MD 05/25/18 1150

## 2018-05-25 NOTE — Discharge Instructions (Addendum)
You can treat your symptoms with daytime allergy medications such as zyrtec or cetirizine. You can continue treating your intermittent rib pain with tylenol and advil.

## 2018-08-28 ENCOUNTER — Encounter (HOSPITAL_COMMUNITY): Payer: Self-pay | Admitting: Emergency Medicine

## 2018-08-28 ENCOUNTER — Emergency Department (HOSPITAL_COMMUNITY)
Admission: EM | Admit: 2018-08-28 | Discharge: 2018-08-28 | Disposition: A | Discharge status: Home / Self Care | Payer: Self-pay | Attending: Emergency Medicine | Admitting: Emergency Medicine

## 2018-08-28 ENCOUNTER — Other Ambulatory Visit: Payer: Self-pay

## 2018-08-28 DIAGNOSIS — F17210 Nicotine dependence, cigarettes, uncomplicated: Secondary | ICD-10-CM | POA: Insufficient documentation

## 2018-08-28 DIAGNOSIS — T672XXA Heat cramp, initial encounter: Secondary | ICD-10-CM | POA: Insufficient documentation

## 2018-08-28 MED ORDER — SODIUM CHLORIDE 0.9 % IV BOLUS: 1000.0000 mL | Freq: Once | INTRAVENOUS | Status: DC

## 2018-08-28 NOTE — ED Triage Notes (Signed)
Pt states Wed he worked in the heat and passed out due to heat, pt reports having intermittent muscle spasms since, pt reports he has attempted to rehydrate since using Gatorade and water

## 2018-08-28 NOTE — ED Notes (Signed)
Pt ambulated to waiting room without any complications

## 2018-08-28 NOTE — ED Provider Notes (Signed)
[facility] EMERGENCY DEPARTMENT Provider Note   CSN: [id number] Arrival date & time: 08/28/18  1938  Time seen 11:08 PM  History   Chief Complaint Chief Complaint  Patient presents with  . Spasms    HPI Jordan Garrett is a 43 y.o. male.     HPI   Patient states July 29 he was working outside for about 7 hours with the heat index over 100.  He states he was sweating a lot.  He states when he got in the work truck he "locked up" meaning he was having spasms.  He states he felt better the next day after drinking a lot of fluids and then his symptoms resolved on July 31.  He denies any nausea, vomiting, or diarrhea.  He denies any loss of consciousness to me.  He denies having a dry mouth and states he is having urinary output.  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  PCP Patient, No Pcp Per   Past Medical History:  Diagnosis Date  . ADD (attention deficit disorder)     There are no active problems to display for this patient.   History reviewed. No pertinent surgical history.      Home Medications    Prior to Admission medications   Medication Sig Start Date End Date Taking? Authorizing Provider  ibuprofen (ADVIL,MOTRIN) 200 MG tablet Take 800 mg by mouth every 6 (six) hours as needed.    [provider]  penicillin v potassium (VEETID) 500 MG tablet Take 1 tablet (500 mg total) by mouth 4 (four) times daily. 12/03/17   Ripley Fraise, MD    Family History No family history on file.  Social History Social History   Tobacco Use  . Smoking status: Current Every Day Smoker    Packs/day: 1.50    Years: 15.00    Pack years: 22.50    Types: Cigarettes  . Smokeless tobacco: Never Used  Substance Use Topics  . Alcohol use: No  . Drug use: No  employed   Allergies   Patient has no known allergies.   Review of Systems Review of Systems  All other systems reviewed and are negative.    Physical Exam Updated Vital Signs BP  (!) 160/106 (BP Location: Right Arm)   Pulse 99   Temp 99 F (37.2 C) (Oral)   Resp 16   Ht 5\' 9"  (1.753 m)   Wt 73.9 kg   SpO2 100%   BMI 24.07 kg/m   Physical Exam Vitals signs and nursing note reviewed.  Constitutional:      General: He is not in acute distress.    Appearance: Normal appearance. He is well-developed. He is not ill-appearing or toxic-appearing.  HENT:     Head: Normocephalic and atraumatic.     Right Ear: External ear normal.     Left Ear: External ear normal.     Nose: Nose normal. No mucosal edema or rhinorrhea.     Mouth/Throat:     Mouth: Mucous membranes are moist.     Dentition: No dental abscesses.     Pharynx: No uvula swelling.  Eyes:     Extraocular Movements: Extraocular movements intact.     Conjunctiva/sclera: Conjunctivae normal.     Pupils: Pupils are equal, round, and reactive to light.  Neck:     Musculoskeletal: Full passive range of motion without pain, normal range of motion and neck supple.  Cardiovascular:     Rate and Rhythm:  Normal rate and regular rhythm.     Heart sounds: Normal heart sounds. No murmur. No friction rub. No gallop.   Pulmonary:     Effort: Pulmonary effort is normal. No respiratory distress.     Breath sounds: Normal breath sounds. No wheezing, rhonchi or rales.  Chest:     Chest wall: No tenderness or crepitus.  Musculoskeletal: Normal range of motion.        General: No tenderness.     Comments: Moves all extremities well.   Skin:    General: Skin is warm and dry.     Coloration: Skin is not pale.     Findings: No erythema or rash.  Neurological:     General: No focal deficit present.     Mental Status: He is alert and oriented to person, place, and time.     Cranial Nerves: No cranial nerve deficit.  Psychiatric:        Mood and Affect: Mood normal. Mood is not anxious.        Speech: Speech normal.        Behavior: Behavior normal.        Thought Content: Thought content normal.      ED  Treatments / Results  Labs (all labs ordered are listed, but only abnormal results are displayed) none  EKG None  Radiology No results found.  Procedures Procedures (including critical care time)  Medications Ordered in ED Medications - No data to display   Initial Impression / Assessment and Plan / ED Course  I have reviewed the triage vital signs and the nursing notes.  Pertinent labs & imaging results that were available during my care of the patient were reviewed by me and considered in my medical decision making (see chart for details).    When I first read patient's chart I had ordered IV fluids and lab work.  However when I went in to talk to him he is currently symptom-free.  His symptoms have been resolved for 3 days.  He is drinking well without difficulty, specifically no vomiting or diarrhea.  He is having adequate urinary output.  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  Final Clinical Impressions(s) / ED Diagnoses   Final diagnoses:  Heat cramps, initial encounter    ED Discharge Orders    None     Plan discharge  Devoria AlbeIva Leigh Blas, MD, Concha PyoFACEP    Rane Dumm, MD 08/28/18 2320

## 2018-08-28 NOTE — Discharge Instructions (Signed)
You need to drink plenty of fluids, especially sports drinks when you are working outside in the heat.  Recheck if you have symptoms that don't resolve with drinking a lot of fluids.

## 2018-08-28 NOTE — ED Notes (Signed)
Pt refused final VS 

## 2018-10-09 ENCOUNTER — Emergency Department (HOSPITAL_COMMUNITY): Admission: EM | Admit: 2018-10-09 | Discharge: 2018-10-09 | Discharge status: Against Medical Advice | Payer: Self-pay

## 2018-10-09 ENCOUNTER — Emergency Department (HOSPITAL_COMMUNITY)
Admission: EM | Admit: 2018-10-09 | Discharge: 2018-10-09 | Disposition: A | Discharge status: Home / Self Care | Payer: Self-pay | Attending: Emergency Medicine | Admitting: Emergency Medicine

## 2018-10-09 ENCOUNTER — Encounter (HOSPITAL_COMMUNITY): Payer: Self-pay | Admitting: *Deleted

## 2018-10-09 ENCOUNTER — Other Ambulatory Visit: Payer: Self-pay

## 2018-10-09 DIAGNOSIS — K0889 Other specified disorders of teeth and supporting structures: Secondary | ICD-10-CM

## 2018-10-09 DIAGNOSIS — K047 Periapical abscess without sinus: Secondary | ICD-10-CM | POA: Insufficient documentation

## 2018-10-09 DIAGNOSIS — K029 Dental caries, unspecified: Secondary | ICD-10-CM | POA: Insufficient documentation

## 2018-10-09 DIAGNOSIS — F17210 Nicotine dependence, cigarettes, uncomplicated: Secondary | ICD-10-CM | POA: Insufficient documentation

## 2018-10-09 MED ORDER — PENICILLIN V POTASSIUM 500 MG PO TABS: 500.0000 mg | ORAL_TABLET | Freq: Four times a day (QID) | ORAL | 0 refills | Status: DC

## 2018-10-09 MED ORDER — PENICILLIN V POTASSIUM 250 MG PO TABS
500.0000 mg | ORAL_TABLET | Freq: Once | ORAL | Status: AC
  Administered 2018-10-09: 500 mg via ORAL
  Filled 2018-10-09: qty 2

## 2018-10-09 NOTE — Discharge Instructions (Addendum)
Take the penicillin until gone.  Look at the dental referral sheets.  You can take ibuprofen 600 mg plus acetaminophen 650 mg every 6 hours as needed for pain. Recheck at the ED if you get a fever, get a lot of facial swelling, or have difficulty swallowing or breathing.

## 2018-10-09 NOTE — ED Provider Notes (Signed)
[facility] EMERGENCY DEPARTMENT Provider Note   CSN: [id number] Arrival date & time: 10/09/18  0225   Time seen 3:20 AM  History   Chief Complaint Chief Complaint  Patient presents with  . Dental Pain    HPI Jordan Garrett is a 43 y.o. male.     HPI patient is very hard to get him to tell what is going on.  He keeps saying "I know I need a dentist and I need antibiotics".  Basically he states for the past week he has been having some pain in his upper teeth and a large cavity in the left lower molar that he has been packing with something he got at the dollar store which helps.  He has been using Orajel and ibuprofen which helps of his pain.  He states currently he has no pain.  He denies fever.  He states he did have facial swelling but it is gone now.  PCP Patient, No Pcp Per   Past Medical History:  Diagnosis Date  . ADD (attention deficit disorder)     There are no active problems to display for this patient.   History reviewed. No pertinent surgical history.      Home Medications    Prior to Admission medications   Medication Sig Start Date End Date Taking? Authorizing Provider  ibuprofen (ADVIL,MOTRIN) 200 MG tablet Take 800 mg by mouth every 6 (six) hours as needed.    [provider]  penicillin v potassium (VEETID) 500 MG tablet Take 1 tablet (500 mg total) by mouth 4 (four) times daily. 10/09/18   Devoria AlbeKnapp, Tanyon Alipio, MD    Family History No family history on file.  Social History Social History   Tobacco Use  . Smoking status: Current Every Day Smoker    Packs/day: 1.50    Years: 15.00    Pack years: 22.50    Types: Cigarettes  . Smokeless tobacco: Never Used  Substance Use Topics  . Alcohol use: No  . Drug use: No  employed   Allergies   Patient has no known allergies.   Review of Systems Review of Systems  All other systems reviewed and are negative.    Physical Exam Updated Vital Signs BP (!) 142/84   Pulse (!) 103   Temp  98.2 F (36.8 C) (Oral)   Resp 16   Ht 5\' 9"  (1.753 m)   Wt 72.6 kg   SpO2 100%   BMI 23.63 kg/m   Physical Exam Vitals signs and nursing note reviewed.  Constitutional:      Appearance: Normal appearance.  HENT:     Head: Normocephalic and atraumatic.     Nose: Nose normal.     Mouth/Throat:     Mouth: Mucous membranes are moist.     Pharynx: Oropharynx is clear.     Comments: When I look at patient's mouth he has upper incisors and some of the first molars are all rotted to the gumline.  There is maybe some mild redness and swelling of the gums, there is no obvious abscess seen.  His lower teeth are better however he still has several scattered dental caries.  There is no soft palate swelling.  There is no facial swelling seen. Eyes:     Extraocular Movements: Extraocular movements intact.     Conjunctiva/sclera: Conjunctivae normal.     Pupils: Pupils are equal, round, and reactive to light.  Neck:     Musculoskeletal: Normal range of motion.  Cardiovascular:  Rate and Rhythm: Normal rate.  Pulmonary:     Effort: Pulmonary effort is normal. No respiratory distress.  Musculoskeletal: Normal range of motion.  Neurological:     General: No focal deficit present.     Mental Status: He is alert and oriented to person, place, and time.     Cranial Nerves: No cranial nerve deficit.  Psychiatric:        Mood and Affect: Mood normal.      ED Treatments / Results  Labs (all labs ordered are listed, but only abnormal results are displayed) Labs Reviewed - No data to display  EKG None  Radiology No results found.  Procedures Procedures (including critical care time)  Medications Ordered in ED Medications  penicillin v potassium (VEETID) tablet 500 mg (has no administration in time range)     Initial Impression / Assessment and Plan / ED Course  I have reviewed the triage vital signs and the nursing notes.  Pertinent labs & imaging results that were available  during my care of the patient were reviewed by me and considered in my medical decision making (see chart for details).     Patient was started on Pen-Vee K.  He states he currently has no pain.  Evidently what he is doing at home is helping.  He knows he needs to get a dentist, he was given a Arts development officer.  We also discussed he probably will need to see an oral surgeon since most of these teeth have been rotted to the gumline.  Final Clinical Impressions(s) / ED Diagnoses   Final diagnoses:  Pain, dental  Dental caries  Dental infection    ED Discharge Orders         Ordered    penicillin v potassium (VEETID) 500 MG tablet  4 times daily     10/09/18 0338          Plan discharge  Rolland Porter, MD, Barbette Or, MD 10/09/18 [phone]

## 2018-10-09 NOTE — ED Notes (Signed)
Pt. States he is leaving due to wait and already knowing whats going on. Pulling OTF.

## 2018-10-09 NOTE — ED Triage Notes (Signed)
Pt c/o dental pain that started a few weeks ago,

## 2018-10-13 ENCOUNTER — Encounter (HOSPITAL_COMMUNITY): Payer: Self-pay

## 2018-10-13 ENCOUNTER — Emergency Department (HOSPITAL_COMMUNITY)
Admission: EM | Admit: 2018-10-13 | Discharge: 2018-10-13 | Disposition: A | Discharge status: Home / Self Care | Payer: Self-pay | Attending: Emergency Medicine | Admitting: Emergency Medicine

## 2018-10-13 ENCOUNTER — Other Ambulatory Visit: Payer: Self-pay

## 2018-10-13 DIAGNOSIS — F17210 Nicotine dependence, cigarettes, uncomplicated: Secondary | ICD-10-CM | POA: Insufficient documentation

## 2018-10-13 DIAGNOSIS — K0889 Other specified disorders of teeth and supporting structures: Secondary | ICD-10-CM | POA: Insufficient documentation

## 2018-10-13 MED ORDER — CLINDAMYCIN HCL 150 MG PO CAPS: 150.0000 mg | ORAL_CAPSULE | Freq: Three times a day (TID) | ORAL | 0 refills | Status: DC

## 2018-10-13 NOTE — Discharge Instructions (Signed)
Stop taking your previous antibiotic and start clindamycin for broader coverage. Please call for an appointment with a dentist or oral surgeon.

## 2018-10-13 NOTE — ED Triage Notes (Signed)
Pt reports was seen here Sunday for dental pain and was put on antibiotics.  Reports is taking them as prescribed but woke up with r sided facial swelling this morning.

## 2018-10-13 NOTE — ED Provider Notes (Signed)
[facility] Provider Note   CSN: [id number] Arrival date & time: 10/13/18  1032     History   Chief Complaint Chief Complaint  Patient presents with  . Dental Pain    HPI Jordan Garrett is a 43 y.o. male.     Patient presents with worsening dental pain and facial swelling.  Patient has poor dentition history problems 15 years.  No current dentist.  Patient was seen yesterday and given penicillin to take.  Patient denies fevers chills or shortness of breath.     Past Medical History:  Diagnosis Date  . ADD (attention deficit disorder)     There are no active problems to display for this patient.   History reviewed. No pertinent surgical history.      Home Medications    Prior to Admission medications   Medication Sig Start Date End Date Taking? Authorizing Provider  clindamycin (CLEOCIN) 150 MG capsule Take 1 capsule (150 mg total) by mouth 3 (three) times daily. 10/13/18   Elnora Morrison, MD  ibuprofen (ADVIL,MOTRIN) 200 MG tablet Take 800 mg by mouth every 6 (six) hours as needed.    [provider]    Family History No family history on file.  Social History Social History   Tobacco Use  . Smoking status: Current Every Day Smoker    Packs/day: 1.50    Years: 15.00    Pack years: 22.50    Types: Cigarettes  . Smokeless tobacco: Never Used  Substance Use Topics  . Alcohol use: No  . Drug use: No     Allergies   Patient has no known allergies.   Review of Systems Review of Systems  Constitutional: Negative for chills and fever.  HENT: Positive for dental problem.   Respiratory: Negative for shortness of breath.   Gastrointestinal: Negative for vomiting.     Physical Exam Updated Vital Signs BP (!) 162/114 (BP Location: Right Arm)   Pulse 84   Temp 98.3 F (36.8 C) (Oral)   Resp 16   Ht 5\' 9"  (1.753 m)   Wt 72 kg   SpO2 100%   BMI 23.44 kg/m   Physical Exam Vitals signs and nursing note reviewed.   Constitutional:      Appearance: He is well-developed.  HENT:     Head: Normocephalic and atraumatic.     Comments: Patient has extremely poor dentition multiple missing teeth, gingivitis diffuse.  Patient has focal area of tenderness and inflammation above right upper incisor region below right nare.  No abscess palpated.  Mild tender to palpation.  No submandibular tenderness or swelling.  No trismus Eyes:     General:        Right eye: No discharge.        Left eye: No discharge.  Neck:     Musculoskeletal: Normal range of motion and neck supple.     Trachea: No tracheal deviation.  Cardiovascular:     Rate and Rhythm: Normal rate.  Pulmonary:     Effort: Pulmonary effort is normal.  Abdominal:     Tenderness: There is no guarding.  Skin:    General: Skin is warm.     Findings: No rash.  Neurological:     Mental Status: He is alert and oriented to person, place, and time.      ED Treatments / Results  Labs (all labs ordered are listed, but only abnormal results are displayed) Labs Reviewed - No data to display  EKG None  Radiology No results found.  Procedures Ultrasound ED Soft Tissue  Date/Time: 10/13/2018 11:32 AM Performed by: Blane OharaZavitz, Sonda Coppens, MD Authorized by: Blane OharaZavitz, Crista Nuon, MD   Procedure details:    Indications: evaluate for cellulitis     Transverse view:  Visualized   Images: archived   Location:    Location: face     Side:  Right Findings:     no abscess present    cellulitis present   (including critical care time)  Medications Ordered in ED Medications - No data to display   Initial Impression / Assessment and Plan / ED Course  I have reviewed the triage vital signs and the nursing notes.  Pertinent labs & imaging results that were available during my care of the patient were reviewed by me and considered in my medical decision making (see chart for details).       Patient presents with worsening swelling and dental pain concern for  early abscess versus cellulitis.  Bedside ultrasound performed no focal abscess to drain at this time.  Discussed changing to clindamycin and follow-up with a dentist.  Final Clinical Impressions(s) / ED Diagnoses   Final diagnoses:  Pain, dental    ED Discharge Orders         Ordered    clindamycin (CLEOCIN) 150 MG capsule  3 times daily     10/13/18 1128           Blane OharaZavitz, Happy Ky, MD 10/13/18 1133

## 2019-02-22 ENCOUNTER — Encounter (HOSPITAL_COMMUNITY): Payer: Self-pay | Admitting: *Deleted

## 2019-02-22 ENCOUNTER — Emergency Department (HOSPITAL_COMMUNITY)
Admission: EM | Admit: 2019-02-22 | Discharge: 2019-02-23 | Disposition: A | Discharge status: Home / Self Care | Payer: Self-pay | Attending: Emergency Medicine | Admitting: Emergency Medicine

## 2019-02-22 ENCOUNTER — Other Ambulatory Visit: Payer: Self-pay

## 2019-02-22 DIAGNOSIS — L03311 Cellulitis of abdominal wall: Secondary | ICD-10-CM | POA: Insufficient documentation

## 2019-02-22 DIAGNOSIS — L02211 Cutaneous abscess of abdominal wall: Secondary | ICD-10-CM | POA: Insufficient documentation

## 2019-02-22 DIAGNOSIS — F17210 Nicotine dependence, cigarettes, uncomplicated: Secondary | ICD-10-CM | POA: Insufficient documentation

## 2019-02-22 DIAGNOSIS — Z79899 Other long term (current) drug therapy: Secondary | ICD-10-CM | POA: Insufficient documentation

## 2019-02-22 MED ORDER — IBUPROFEN 400 MG PO TABS
600.0000 mg | ORAL_TABLET | Freq: Once | ORAL | Status: AC
  Administered 2019-02-23: 600 mg via ORAL
  Filled 2019-02-22: qty 2

## 2019-02-22 MED ORDER — CLINDAMYCIN HCL 150 MG PO CAPS
300.0000 mg | ORAL_CAPSULE | Freq: Once | ORAL | Status: AC
  Administered 2019-02-23: 300 mg via ORAL
  Filled 2019-02-22: qty 2

## 2019-02-22 MED ORDER — HYDROCODONE-ACETAMINOPHEN 5-325 MG PO TABS
1.0000 | ORAL_TABLET | Freq: Once | ORAL | Status: AC
  Administered 2019-02-23: 1 via ORAL
  Filled 2019-02-22: qty 1

## 2019-02-22 MED ORDER — LIDOCAINE-EPINEPHRINE (PF) 1 %-1:200000 IJ SOLN
10.0000 mL | Freq: Once | INTRAMUSCULAR | Status: AC
  Administered 2019-02-23: 10 mL via INTRADERMAL
  Filled 2019-02-22: qty 30

## 2019-02-22 NOTE — ED Triage Notes (Signed)
Pt c/o abcess to suprapubic area x 6 days; pt states he squeezed out some green looking discharge

## 2019-02-23 LAB — CBC WITH DIFFERENTIAL/PLATELET
Abs Immature Granulocytes: 0.03 10*3/uL (ref 0.00–0.07)
Basophils Absolute: 0.1 10*3/uL (ref 0.0–0.1)
Basophils Relative: 1 %
Eosinophils Absolute: 0.2 10*3/uL (ref 0.0–0.5)
Eosinophils Relative: 1 %
HCT: 44.5 % (ref 39.0–52.0)
Hemoglobin: 14.8 g/dL (ref 13.0–17.0)
Immature Granulocytes: 0 %
Lymphocytes Relative: 22 %
Lymphs Abs: 2.6 10*3/uL (ref 0.7–4.0)
MCH: 30 pg (ref 26.0–34.0)
MCHC: 33.3 g/dL (ref 30.0–36.0)
MCV: 90.3 fL (ref 80.0–100.0)
Monocytes Absolute: 1 10*3/uL (ref 0.1–1.0)
Monocytes Relative: 9 %
Neutro Abs: 7.8 10*3/uL — ABNORMAL HIGH (ref 1.7–7.7)
Neutrophils Relative %: 67 %
Platelets: 188 10*3/uL (ref 150–400)
RBC: 4.93 MIL/uL (ref 4.22–5.81)
RDW: 13.2 % (ref 11.5–15.5)
WBC: 11.7 10*3/uL — ABNORMAL HIGH (ref 4.0–10.5)
nRBC: 0 % (ref 0.0–0.2)

## 2019-02-23 LAB — BASIC METABOLIC PANEL
Anion gap: 8 (ref 5–15)
BUN: 15 mg/dL (ref 6–20)
CO2: 23 mmol/L (ref 22–32)
Calcium: 8.9 mg/dL (ref 8.9–10.3)
Chloride: 106 mmol/L (ref 98–111)
Creatinine, Ser: 0.76 mg/dL (ref 0.61–1.24)
GFR calc Af Amer: >60 mL/min (ref >60)
GFR calc non Af Amer: >60 mL/min (ref >60)
Glucose, Bld: 94 mg/dL (ref 70–99)
Potassium: 3.7 mmol/L (ref 3.5–5.1)
Sodium: 137 mmol/L (ref 135–145)

## 2019-02-23 MED ORDER — CLINDAMYCIN HCL 150 MG PO CAPS: 150.0000 mg | ORAL_CAPSULE | Freq: Three times a day (TID) | ORAL | 0 refills | Status: DC

## 2019-02-23 NOTE — ED Provider Notes (Signed)
[facility] EMERGENCY DEPARTMENT Provider Note   CSN: [id number] Arrival date & time: 02/22/19  2216     History Chief Complaint  Patient presents with  . Abscess    Jordan Garrett is a 44 y.o. male.  Patient with no significant medical history, no diabetes presents with pain and swelling and drainage to lower abdomen area for the past week.  Patient was able to squeeze out some purulence.  No fevers or chills.  Pain mild.  Patient does shave in that area possible cause.  Patient denies fevers, vomiting or abdominal pain aside from the skin area.        Past Medical History:  Diagnosis Date  . ADD (attention deficit disorder)     There are no problems to display for this patient.   History reviewed. No pertinent surgical history.     History reviewed. No pertinent family history.  Social History   Tobacco Use  . Smoking status: Current Every Day Smoker    Packs/day: 1.50    Years: 15.00    Pack years: 22.50    Types: Cigarettes  . Smokeless tobacco: Never Used  Substance Use Topics  . Alcohol use: No  . Drug use: No    Home Medications Prior to Admission medications   Medication Sig Start Date End Date Taking? Authorizing Provider  clindamycin (CLEOCIN) 150 MG capsule Take 1 capsule (150 mg total) by mouth 3 (three) times daily. 10/13/18   Elnora Morrison, MD  clindamycin (CLEOCIN) 150 MG capsule Take 1 capsule (150 mg total) by mouth 3 (three) times daily. 02/23/19   Elnora Morrison, MD  ibuprofen (ADVIL,MOTRIN) 200 MG tablet Take 800 mg by mouth every 6 (six) hours as needed.    [provider]    Allergies    Patient has no known allergies.  Review of Systems   Review of Systems  Constitutional: Negative for chills and fever.  HENT: Negative for congestion.   Eyes: Negative for visual disturbance.  Respiratory: Negative for shortness of breath.   Cardiovascular: Negative for chest pain.  Gastrointestinal: Negative for abdominal pain and  vomiting.  Genitourinary: Negative for dysuria and flank pain.  Musculoskeletal: Negative for back pain, neck pain and neck stiffness.  Skin: Positive for rash.  Neurological: Negative for light-headedness and headaches.    Physical Exam Updated Vital Signs BP (!) 144/100   Pulse 99   Temp 98.4 F (36.9 C) (Oral)   Resp 18   Ht 5\' 9"  (1.753 m)   Wt 74.8 kg   SpO2 100%   BMI 24.37 kg/m   Physical Exam Vitals and nursing note reviewed.  Constitutional:      Appearance: He is well-developed.  HENT:     Head: Normocephalic and atraumatic.  Eyes:     General:        Right eye: No discharge.        Left eye: No discharge.     Conjunctiva/sclera: Conjunctivae normal.  Neck:     Trachea: No tracheal deviation.  Cardiovascular:     Rate and Rhythm: Regular rhythm. Tachycardia present.  Pulmonary:     Effort: Pulmonary effort is normal.     Breath sounds: Normal breath sounds.  Abdominal:     General: There is no distension.     Palpations: Abdomen is soft.     Tenderness: There is abdominal tenderness. There is no guarding.  Musculoskeletal:     Cervical back: Normal range of motion and neck supple.  Skin:    General: Skin is warm.     Findings: Rash present.     Comments: Patient has approximately 8 cm diameter area of erythema with central induration approximately 2 cm, mild opening no active drainage.  No crepitus.  Neurological:     Mental Status: He is alert and oriented to person, place, and time.     ED Results / Procedures / Treatments   Labs (all labs ordered are listed, but only abnormal results are displayed) Labs Reviewed  CBC WITH DIFFERENTIAL/PLATELET - Abnormal; Notable for the following components:      Result Value   WBC 11.7 (*)    Neutro Abs 7.8 (*)    All other components within normal limits  BASIC METABOLIC PANEL    EKG None  Radiology No results found.  Procedures .Marland KitchenIncision and Drainage  Date/Time: 02/23/2019 1:45 AM Performed  by: Blane Ohara, MD Authorized by: Blane Ohara, MD   Consent:    Consent obtained:  Verbal   Consent given by:  Patient   Risks discussed:  Bleeding, infection, damage to other organs, incomplete drainage and pain   Alternatives discussed:  No treatment Location:    Type:  Abscess   Size:  1.5 cm   Location:  Trunk   Trunk location:  Abdomen Pre-procedure details:    Skin preparation:  Betadine Anesthesia (see MAR for exact dosages):    Anesthesia method:  Local infiltration   Local anesthetic:  Lidocaine 1% WITH epi Procedure type:    Complexity:  Simple Procedure details:    Incision types:  Stab incision   Incision depth:  Dermal   Scalpel blade:  15   Wound management:  Probed and deloculated   Drainage:  Bloody   Drainage amount:  Scant   Wound treatment:  Wound left open   Packing materials:  None Post-procedure details:    Patient tolerance of procedure:  Tolerated well, no immediate complications Ultrasound ED Soft Tissue  Date/Time: 02/23/2019 1:47 AM Performed by: Blane Ohara, MD Authorized by: Blane Ohara, MD   Procedure details:    Indications: localization of abscess and evaluate for cellulitis     Transverse view:  Visualized   Longitudinal view:  Visualized   Images: archived   Location:    Location: abdominal wall   Findings:     abscess present    cellulitis present   (including critical care time)  Medications Ordered in ED Medications  ibuprofen (ADVIL) tablet 600 mg (600 mg Oral Given 02/23/19 0000)  HYDROcodone-acetaminophen (NORCO/VICODIN) 5-325 MG per tablet 1 tablet (1 tablet Oral Given 02/23/19 0000)  clindamycin (CLEOCIN) capsule 300 mg (300 mg Oral Given 02/23/19 0000)  lidocaine-EPINEPHrine (XYLOCAINE-EPINEPHrine) 1 %-1:200000 (PF) injection 10 mL (10 mLs Intradermal Given 02/23/19 0001)    ED Course  I have reviewed the triage vital signs and the nursing notes.  Pertinent labs & imaging results that were available during  my care of the patient were reviewed by me and considered in my medical decision making (see chart for details).    MDM Rules/Calculators/A&P                      Patient presents with clinically cellulitis and small abscess.  Pain medicines given, blood work obtained due to tachycardia however tachycardia likely from pain.  Improved with pain medication observation.  Blood work reviewed normal hemoglobin, minimally elevated white blood cell count 11.  Bedside ultrasound showed small abscess and cellulitis.  Small I&D and discussed soaks, antibiotics and reasons to return. HR improved.  Final Clinical Impression(s) / ED Diagnoses Final diagnoses:  Cellulitis of abdominal wall  Abscess of abdominal wall    Rx / DC Orders ED Discharge Orders         Ordered    clindamycin (CLEOCIN) 150 MG capsule  3 times daily     02/23/19 0125           Blane Ohara, MD 02/23/19 0147

## 2019-02-23 NOTE — Discharge Instructions (Signed)
Use Tylenol and ibuprofen as needed for pain.  Take antibiotics as directed.  Return for recurrent fevers, significant swelling, rapidly spreading redness or new concerns.  Work note provided.

## 2019-05-04 ENCOUNTER — Other Ambulatory Visit: Payer: Self-pay

## 2019-05-04 ENCOUNTER — Emergency Department (HOSPITAL_COMMUNITY)
Admission: EM | Admit: 2019-05-04 | Discharge: 2019-05-04 | Disposition: A | Discharge status: Home / Self Care | Payer: Self-pay | Attending: Emergency Medicine | Admitting: Emergency Medicine

## 2019-05-04 ENCOUNTER — Encounter (HOSPITAL_COMMUNITY): Payer: Self-pay

## 2019-05-04 DIAGNOSIS — J020 Streptococcal pharyngitis: Secondary | ICD-10-CM | POA: Insufficient documentation

## 2019-05-04 DIAGNOSIS — F17210 Nicotine dependence, cigarettes, uncomplicated: Secondary | ICD-10-CM | POA: Insufficient documentation

## 2019-05-04 LAB — GROUP A STREP BY PCR: Group A Strep by PCR: DETECTED — AB

## 2019-05-04 MED ORDER — DEXAMETHASONE 4 MG PO TABS
8.0000 mg | ORAL_TABLET | Freq: Once | ORAL | Status: AC
  Administered 2019-05-04: 09:00:00 8 mg via ORAL
  Filled 2019-05-04: qty 2

## 2019-05-04 MED ORDER — ACETAMINOPHEN 325 MG PO TABS
650.0000 mg | ORAL_TABLET | Freq: Once | ORAL | Status: AC
  Administered 2019-05-04: 650 mg via ORAL
  Filled 2019-05-04: qty 2

## 2019-05-04 MED ORDER — PENICILLIN G BENZATHINE & PROC 1200000 UNIT/2ML IM SUSP
1.2000 10*6.[IU] | Freq: Once | INTRAMUSCULAR | Status: AC
  Administered 2019-05-04: 10:00:00 1.2 10*6.[IU] via INTRAMUSCULAR
  Filled 2019-05-04: qty 2

## 2019-05-04 MED ORDER — LIDOCAINE VISCOUS HCL 2 % MT SOLN
15.0000 mL | Freq: Once | OROMUCOSAL | Status: AC
  Administered 2019-05-04: 09:00:00 15 mL via OROMUCOSAL
  Filled 2019-05-04: qty 15

## 2019-05-04 NOTE — ED Provider Notes (Signed)
[facility] Provider Note   CSN: [id number] Arrival date & time: 05/04/19  7371     History Chief Complaint  Patient presents with  . Sore Throat    Jordan Garrett is a 44 y.o. male presents to emergency department today with chief complaint of progressively worsening sore throat x2 days.  Patient had a negative Covid test yesterday. He describes sore throat as burning and aching pain when swallowing. He also endorses nasal congestion. He has tried mucinex and throat lozenges with minimal symptom relief. He denies any sick contacts. Denies fever, chills, cough, rash, neck pain, chest pain, shortness of breath, abdominal pain, nausea, vomiting, urinary symptoms, diarrhea, loss of sense of taste or smell.   History provided by patient with additional history obtained from chart review.       Past Medical History:  Diagnosis Date  . ADD (attention deficit disorder)     There are no problems to display for this patient.   History reviewed. No pertinent surgical history.     No family history on file.  Social History   Tobacco Use  . Smoking status: Current Every Day Smoker    Packs/day: 1.50    Years: 15.00    Pack years: 22.50    Types: Cigarettes  . Smokeless tobacco: Never Used  Substance Use Topics  . Alcohol use: No  . Drug use: No    Home Medications Prior to Admission medications   Not on File    Allergies    Patient has no known allergies.  Review of Systems   Review of Systems All other systems are reviewed and are negative for acute change except as noted in the HPI.  Physical Exam Updated Vital Signs BP (!) 161/126 (BP Location: Right Arm)   Pulse 95   Temp 98.5 F (36.9 C) (Oral)   Resp 18   Ht 5\' 10"  (1.778 m)   Wt 74.8 kg   SpO2 99%   BMI 23.68 kg/m   Physical Exam Vitals and nursing note reviewed.  Constitutional:      Appearance: He is well-developed. He is not ill-appearing or toxic-appearing.  HENT:   Head: Normocephalic and atraumatic.     Jaw: No trismus.     Right Ear: Tympanic membrane and ear canal normal. No middle ear effusion. Tympanic membrane is not erythematous.     Left Ear: Tympanic membrane and ear canal normal.  No middle ear effusion. Tympanic membrane is not erythematous.     Nose: Congestion present.     Mouth/Throat:     Pharynx: Uvula midline.     Tonsils: No tonsillar abscesses. 1+ on the right. 1+ on the left.     Comments: Minor erythema to oropharynx, no edema, exudate seen on right tonsil, tonsillar swelling 1+ bilaterally, voice normal, neck supple  Eyes:     General: No scleral icterus.       Right eye: No discharge.        Left eye: No discharge.     Conjunctiva/sclera: Conjunctivae normal.  Neck:     Vascular: No JVD.  Cardiovascular:     Rate and Rhythm: Normal rate and regular rhythm.     Pulses: Normal pulses.     Heart sounds: Normal heart sounds.  Pulmonary:     Effort: Pulmonary effort is normal.     Breath sounds: Normal breath sounds.  Abdominal:     General: There is no distension.  Musculoskeletal:  General: Normal range of motion.     Cervical back: Normal range of motion.  Skin:    General: Skin is warm and dry.  Neurological:     Mental Status: He is oriented to person, place, and time.     GCS: GCS eye subscore is 4. GCS verbal subscore is 5. GCS motor subscore is 6.     Comments: Fluent speech, no facial droop.  Psychiatric:        Behavior: Behavior normal.       ED Results / Procedures / Treatments   Labs (all labs ordered are listed, but only abnormal results are displayed) Labs Reviewed  GROUP A STREP BY PCR - Abnormal; Notable for the following components:      Result Value   Group A Strep by PCR DETECTED (*)    All other components within normal limits    EKG None  Radiology No results found.  Procedures Procedures (including critical care time)  Medications Ordered in ED Medications  penicillin g  procaine-penicillin g benzathine (BICILLIN-CR) injection 600000-600000 units (has no administration in time range)  acetaminophen (TYLENOL) tablet 650 mg (650 mg Oral Given 05/04/19 0855)  lidocaine (XYLOCAINE) 2 % viscous mouth solution 15 mL (15 mLs Mouth/Throat Given 05/04/19 0854)  dexamethasone (DECADRON) tablet 8 mg (8 mg Oral Given 05/04/19 1194)    ED Course  I have reviewed the triage vital signs and the nursing notes.  Pertinent labs & imaging results that were available during my care of the patient were reviewed by me and considered in my medical decision making (see chart for details).  Vitals:   05/04/19 0820 05/04/19 0917  BP: (!) 161/126 (!) 157/108  Pulse: 95   Resp: 18   Temp: 98.5 F (36.9 C)   TempSrc: Oral   SpO2: 99%   Weight: 74.8 kg   Height: 5\' 10"  (1.778 m)       MDM Rules/Calculators/A&P                      44 yo males who presents with sore throat. Still able to tolerate PO/secretions but with worsening pain. Patient is afebrile, non-toxic appearing, sitting comfortably on examination table. Vital signs On exam, patient has exudate seen on right tonsil, uvula is midline 1+ bilateral tonsillar swelling. Presentation not concerning for PTA or Ludwig's angina, Uvulitis, epiglottitis, peritonsillar abscess, or retropharyngeal abscess. Strep ordered at triage.   Strep reviewed. Positive. Pt treated with IM penicillin, PO steroids while in the ED. Pt does not appear dehydrated, but did discuss importance of water rehydration.  Encouraged at home supportive care measures. Patient successfully fluid challenged in the ED without difficulty swallowing.  Strict return precautions given. NAD. VSS. Recommended PCP follow up for re-evaluation and to have blood pressure rechecked.   Portions of this note were generated with 54. Dictation errors may occur despite best attempts at proofreading.   Final Clinical Impression(s) / ED Diagnoses Final  diagnoses:  Strep pharyngitis    Rx / DC Orders ED Discharge Orders    None       Scientist, clinical (histocompatibility and immunogenetics), PA-C 05/04/19 1026    07/04/19, MD 05/05/19 [phone]

## 2019-05-04 NOTE — ED Triage Notes (Signed)
Pt began having a sore throat a day ago. Negative COVID test yesterday. Pt has a small amount of white exudate in the back of throat. Lymph nodes swollen as well. Pt in NAD.

## 2019-05-04 NOTE — Discharge Instructions (Addendum)
You were seen in the emergency department and diagnosed with strep throat.  You were given a shot of Penicillin and a pill  of Decadron.  - Penicillin is an antibiotic used to treat the infection - Decadron is a steroid used to treat the pain and swelling of your throat.   You should gradually feel better over the next few days. Take Tylenol and Ibuprofen for fever and pain. Follow up with your primary care provider in the next 1 week if you are not feeling better, if you do not have a primary care provider one is provided in your discharge instructions. Return to the emergency department for any new or worsening symptoms including but not limited to inability to open your mouth, inability to move your neck, worsening pain, change in your voice, inability to swallow your own saliva, drooling, or any other concerns.   

## 2019-05-14 ENCOUNTER — Other Ambulatory Visit: Payer: Self-pay

## 2019-05-14 ENCOUNTER — Emergency Department (HOSPITAL_COMMUNITY)
Admission: EM | Admit: 2019-05-14 | Discharge: 2019-05-15 | Disposition: A | Discharge status: Home / Self Care | Payer: Self-pay | Attending: Emergency Medicine | Admitting: Emergency Medicine

## 2019-05-14 ENCOUNTER — Emergency Department (HOSPITAL_COMMUNITY): Payer: Self-pay

## 2019-05-14 ENCOUNTER — Encounter (HOSPITAL_COMMUNITY): Payer: Self-pay | Admitting: Emergency Medicine

## 2019-05-14 DIAGNOSIS — R1013 Epigastric pain: Secondary | ICD-10-CM

## 2019-05-14 DIAGNOSIS — F17210 Nicotine dependence, cigarettes, uncomplicated: Secondary | ICD-10-CM | POA: Insufficient documentation

## 2019-05-14 DIAGNOSIS — F909 Attention-deficit hyperactivity disorder, unspecified type: Secondary | ICD-10-CM | POA: Insufficient documentation

## 2019-05-14 DIAGNOSIS — K529 Noninfective gastroenteritis and colitis, unspecified: Secondary | ICD-10-CM

## 2019-05-14 LAB — CBC
HCT: 43 % (ref 39.0–52.0)
Hemoglobin: 14.2 g/dL (ref 13.0–17.0)
MCH: 30.1 pg (ref 26.0–34.0)
MCHC: 33 g/dL (ref 30.0–36.0)
MCV: 91.1 fL (ref 80.0–100.0)
Platelets: 234 10*3/uL (ref 150–400)
RBC: 4.72 MIL/uL (ref 4.22–5.81)
RDW: 14.7 % (ref 11.5–15.5)
WBC: 14.1 10*3/uL — ABNORMAL HIGH (ref 4.0–10.5)
nRBC: 0 % (ref 0.0–0.2)

## 2019-05-14 LAB — COMPREHENSIVE METABOLIC PANEL
ALT: 32 U/L (ref 0–44)
AST: 30 U/L (ref 15–41)
Albumin: 3.8 g/dL (ref 3.5–5.0)
Alkaline Phosphatase: 65 U/L (ref 38–126)
Anion gap: 10 (ref 5–15)
BUN: 17 mg/dL (ref 6–20)
CO2: 25 mmol/L (ref 22–32)
Calcium: 9.2 mg/dL (ref 8.9–10.3)
Chloride: 102 mmol/L (ref 98–111)
Creatinine, Ser: 0.88 mg/dL (ref 0.61–1.24)
GFR calc Af Amer: >60 mL/min (ref >60)
GFR calc non Af Amer: >60 mL/min (ref >60)
Glucose, Bld: 90 mg/dL (ref 70–99)
Potassium: 3.9 mmol/L (ref 3.5–5.1)
Sodium: 137 mmol/L (ref 135–145)
Total Bilirubin: 0.6 mg/dL (ref 0.3–1.2)
Total Protein: 7.7 g/dL (ref 6.5–8.1)

## 2019-05-14 LAB — URINALYSIS, ROUTINE W REFLEX MICROSCOPIC
Bilirubin Urine: NEGATIVE
Glucose, UA: NEGATIVE mg/dL
Hgb urine dipstick: NEGATIVE
Ketones, ur: NEGATIVE mg/dL
Leukocytes,Ua: NEGATIVE
Nitrite: NEGATIVE
Protein, ur: NEGATIVE mg/dL
Specific Gravity, Urine: 1.018 (ref 1.005–1.030)
pH: 5 (ref 5.0–8.0)

## 2019-05-14 LAB — LIPASE, BLOOD: Lipase: 21 U/L (ref 11–51)

## 2019-05-14 MED ORDER — SODIUM CHLORIDE 0.9 % IV BOLUS
1000.0000 mL | Freq: Once | INTRAVENOUS | Status: AC
  Administered 2019-05-14: 23:00:00 1000 mL via INTRAVENOUS

## 2019-05-14 MED ORDER — IOHEXOL 300 MG/ML  SOLN
100.0000 mL | Freq: Once | INTRAMUSCULAR | Status: AC | PRN
  Administered 2019-05-14: 100 mL via INTRAVENOUS

## 2019-05-14 MED ORDER — SODIUM CHLORIDE 0.9% FLUSH
3.0000 mL | Freq: Once | INTRAVENOUS | Status: AC
  Administered 2019-05-14: 23:00:00 3 mL via INTRAVENOUS

## 2019-05-14 NOTE — ED Triage Notes (Signed)
Diffuse abd pain since Thursday   Takes OC meds without relief   Last BM yesterday WNL

## 2019-05-14 NOTE — ED Provider Notes (Signed)
[facility] EMERGENCY DEPARTMENT Provider Note   CSN: [id number] Arrival date & time: 05/14/19  2058     History Chief Complaint  Patient presents with  . Abdominal Pain    Jordan Garrett is a 44 y.o. male.  Patient presents to the emergency department for evaluation of abdominal pain.  Patient reports that symptoms have been ongoing for 2 days.  He reports that it is a burning and tearing sensation across the middle of his abdomen.  It does feel like gas, gets better if he takes Pepto-Bismol or Gas-X but then comes back.  No associated fever, nausea, vomiting, constipation.  Stools are loose but this is normal for him.  No melena or rectal bleeding.        Past Medical History:  Diagnosis Date  . ADD (attention deficit disorder)     There are no problems to display for this patient.   History reviewed. No pertinent surgical history.     No family history on file.  Social History   Tobacco Use  . Smoking status: Current Every Day Smoker    Packs/day: 1.00    Years: 15.00    Pack years: 15.00    Types: Cigarettes  . Smokeless tobacco: Never Used  Substance Use Topics  . Alcohol use: No  . Drug use: No    Home Medications Prior to Admission medications   Medication Sig Start Date End Date Taking? Authorizing Provider  dicyclomine (BENTYL) 20 MG tablet Take 1 tablet (20 mg total) by mouth 3 (three) times daily before meals. 05/15/19   Gilda Crease, MD  pantoprazole (PROTONIX) 40 MG tablet Take 1 tablet (40 mg total) by mouth daily. 05/15/19   Gilda Crease, MD    Allergies    Patient has no known allergies.  Review of Systems   Review of Systems  Gastrointestinal: Positive for abdominal pain.  All other systems reviewed and are negative.   Physical Exam Updated Vital Signs BP 127/80 (BP Location: Right Arm)   Pulse (!) 103   Temp 98.4 F (36.9 C) (Oral)   Resp 20   Ht 5\' 9"  (1.753 m)   Wt 72.6 kg   SpO2 100%   BMI 23.63  kg/m   Physical Exam Vitals and nursing note reviewed.  Constitutional:      General: He is not in acute distress.    Appearance: Normal appearance. He is well-developed.  HENT:     Head: Normocephalic and atraumatic.     Right Ear: Hearing normal.     Left Ear: Hearing normal.     Nose: Nose normal.  Eyes:     Conjunctiva/sclera: Conjunctivae normal.     Pupils: Pupils are equal, round, and reactive to light.  Cardiovascular:     Rate and Rhythm: Regular rhythm.     Heart sounds: S1 normal and S2 normal. No murmur. No friction rub. No gallop.   Pulmonary:     Effort: Pulmonary effort is normal. No respiratory distress.     Breath sounds: Normal breath sounds.  Chest:     Chest wall: No tenderness.  Abdominal:     General: Bowel sounds are normal.     Palpations: Abdomen is soft.     Tenderness: There is abdominal tenderness in the epigastric area and periumbilical area. There is no guarding or rebound. Negative signs include Murphy's sign and McBurney's sign.     Hernia: No hernia is present.  Musculoskeletal:  General: Normal range of motion.     Cervical back: Normal range of motion and neck supple.  Skin:    General: Skin is warm and dry.     Findings: No rash.  Neurological:     Mental Status: He is alert and oriented to person, place, and time.     GCS: GCS eye subscore is 4. GCS verbal subscore is 5. GCS motor subscore is 6.     Cranial Nerves: No cranial nerve deficit.     Sensory: No sensory deficit.     Coordination: Coordination normal.  Psychiatric:        Speech: Speech normal.        Behavior: Behavior normal.        Thought Content: Thought content normal.     ED Results / Procedures / Treatments   Labs (all labs ordered are listed, but only abnormal results are displayed) Labs Reviewed  CBC - Abnormal; Notable for the following components:      Result Value   WBC 14.1 (*)    All other components within normal limits  LIPASE, BLOOD   COMPREHENSIVE METABOLIC PANEL  URINALYSIS, ROUTINE W REFLEX MICROSCOPIC    EKG None  Radiology CT ABDOMEN PELVIS W CONTRAST  Result Date: 05/14/2019 CLINICAL DATA:  44 year old male with generalized abdominal pain. EXAM: CT ABDOMEN AND PELVIS WITH CONTRAST TECHNIQUE: Multidetector CT imaging of the abdomen and pelvis was performed using the standard protocol following bolus administration of intravenous contrast. CONTRAST:  OMNIPAQUE IOHEXOL 300 MG/ML  SOLN COMPARISON:  CT abdomen pelvis dated 03/19/2004. FINDINGS: Lower chest: The visualized lung bases are clear. No intra-abdominal free air or free fluid. Hepatobiliary: Probable mild fatty liver. No intrahepatic biliary ductal dilatation. The gallbladder is unremarkable. Pancreas: Unremarkable. No pancreatic ductal dilatation or surrounding inflammatory changes. Spleen: Normal in size without focal abnormality. Adrenals/Urinary Tract: The adrenal glands are unremarkable. There is no hydronephrosis on either side. There is symmetric enhancement and excretion of contrast by both kidneys. The visualized ureters and urinary bladder appear unremarkable. Stomach/Bowel: There is a thickened and inflamed loop of small bowel in the pelvis consistent with enteritis which may be inflammatory or infectious in etiology. Clinical correlation is recommended. There is no bowel obstruction. The appendix is unremarkable as visualized. 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 There is mild atherosclerotic calcification of the aorta. No portal venous gas. There is no adenopathy. Reproductive: The prostate and seminal vesicles are grossly unremarkable. Other: Small fat containing umbilical hernia Musculoskeletal: Degenerative changes of the spine. No acute osseous pathology. IMPRESSION: 1. Enteritis of a segment of distal small bowel in the pelvis which may be inflammatory or infectious in etiology. Clinical correlation is recommended. No  bowel obstruction. Normal appendix. 2. Aortic Atherosclerosis (ICD10-I70.0). Electronically Signed   By: Elgie Collard M.D.   On: 05/14/2019 23:46    Procedures Procedures (including critical care time)  Medications Ordered in ED Medications  sodium chloride flush (NS) 0.9 % injection 3 mL (3 mLs Intravenous Given 05/14/19 2235)  sodium chloride 0.9 % bolus 1,000 mL (0 mLs Intravenous Stopped 05/15/19 0043)  iohexol (OMNIPAQUE) 300 MG/ML solution 100 mL (100 mLs Intravenous Contrast Given 05/14/19 2331)    ED Course  I have reviewed the triage vital signs and the nursing notes.  Pertinent labs & imaging results that were available during my care of the patient were reviewed by me and considered in my medical decision making (see chart for details).  MDM Rules/Calculators/A&P                      Patient presents to the emergency department for evaluation of abdominal pain.  Symptoms ongoing for 2 days.  Patient describes the pain as burning and tearing across the middle portion of his abdomen.  It has improved with Pepto-Bismol and gas medication.  No associated nausea, vomiting, diarrhea or constipation.  Lab work reveals mild leukocytosis, otherwise normal.  CT scan shows enteritis but no other abnormality.  Normal appendix.  Symptomatic treatment is appropriate. Final Clinical Impression(s) / ED Diagnoses Final diagnoses:  Epigastric pain  Enteritis    Rx / DC Orders ED Discharge Orders         Ordered    pantoprazole (PROTONIX) 40 MG tablet  Daily     05/15/19 0051    dicyclomine (BENTYL) 20 MG tablet  3 times daily before meals     05/15/19 0051           Orpah Greek, MD 05/15/19 [phone]

## 2019-05-15 MED ORDER — PANTOPRAZOLE SODIUM 40 MG PO TBEC: 40.0000 mg | DELAYED_RELEASE_TABLET | Freq: Every day | ORAL | 3 refills | Status: DC

## 2019-05-15 MED ORDER — DICYCLOMINE HCL 20 MG PO TABS: 20.0000 mg | ORAL_TABLET | Freq: Three times a day (TID) | ORAL | 0 refills | Status: DC

## 2019-08-26 ENCOUNTER — Emergency Department (HOSPITAL_COMMUNITY)
Admission: EM | Admit: 2019-08-26 | Discharge: 2019-08-26 | Disposition: A | Discharge status: Home / Self Care | Payer: Self-pay

## 2020-01-30 ENCOUNTER — Other Ambulatory Visit: Payer: Self-pay

## 2020-01-30 ENCOUNTER — Encounter (HOSPITAL_COMMUNITY): Payer: Self-pay | Admitting: Emergency Medicine

## 2020-01-30 ENCOUNTER — Emergency Department (HOSPITAL_COMMUNITY)
Admission: EM | Admit: 2020-01-30 | Discharge: 2020-01-30 | Disposition: A | Discharge status: Home / Self Care | Payer: Self-pay | Attending: Emergency Medicine | Admitting: Emergency Medicine

## 2020-01-30 DIAGNOSIS — F17210 Nicotine dependence, cigarettes, uncomplicated: Secondary | ICD-10-CM | POA: Insufficient documentation

## 2020-01-30 DIAGNOSIS — K047 Periapical abscess without sinus: Secondary | ICD-10-CM | POA: Insufficient documentation

## 2020-01-30 DIAGNOSIS — R220 Localized swelling, mass and lump, head: Secondary | ICD-10-CM

## 2020-01-30 MED ORDER — AMOXICILLIN 250 MG PO CAPS
500.0000 mg | ORAL_CAPSULE | Freq: Once | ORAL | Status: AC
  Administered 2020-01-30: 500 mg via ORAL
  Filled 2020-01-30: qty 2

## 2020-01-30 MED ORDER — AMOXICILLIN 500 MG PO CAPS: 500.0000 mg | ORAL_CAPSULE | Freq: Three times a day (TID) | ORAL | 0 refills | Status: DC

## 2020-01-30 NOTE — ED Provider Notes (Addendum)
[facility] EMERGENCY DEPARTMENT Provider Note   CSN: [id number] Arrival date & time: 01/30/20  0507   Time seen 6:18 AM  History Chief Complaint  Patient presents with  . Facial Swelling    Jordan Garrett is a 45 y.o. male.  HPI   Patient reports he has not seen a dentist in many years.  He states he has pain in his teeth off and on "for a while".  However he states December 29 he started having pain in his left upper teeth and now has some mild swelling of his face in that same area.  He denies any fever, difficulty swallowing or breathing.  He states chewing makes his teeth hurt more, he has not noticed any change with heat or cold temperature.  He has been taking ibuprofen 800 mg a couple times a day for pain.  PCP Patient, No Pcp Per   Past Medical History:  Diagnosis Date  . ADD (attention deficit disorder)     There are no problems to display for this patient.   History reviewed. No pertinent surgical history.     History reviewed. No pertinent family history.  Social History   Tobacco Use  . Smoking status: Current Every Day Smoker    Packs/day: 1.00    Years: 15.00    Pack years: 15.00    Types: Cigarettes  . Smokeless tobacco: Never Used  Vaping Use  . Vaping Use: Never used  Substance Use Topics  . Alcohol use: No  . Drug use: No  employed  Home Medications Prior to Admission medications   Medication Sig Start Date End Date Taking? Authorizing Provider  amoxicillin (AMOXIL) 500 MG capsule Take 1 capsule (500 mg total) by mouth 3 (three) times daily. 01/30/20  Yes Devoria Albe, MD  dicyclomine (BENTYL) 20 MG tablet Take 1 tablet (20 mg total) by mouth 3 (three) times daily before meals. 05/15/19   Gilda Crease, MD  pantoprazole (PROTONIX) 40 MG tablet Take 1 tablet (40 mg total) by mouth daily. 05/15/19   Gilda Crease, MD    Allergies    Patient has no known allergies.  Review of Systems   Review of Systems  All other systems  reviewed and are negative.   Physical Exam Updated Vital Signs BP (!) 145/88 (BP Location: Left Arm)   Pulse 73   Temp 97.8 F (36.6 C) (Oral)   Resp 17   Ht 5\' 9"  (1.753 m)   Wt 74.8 kg   SpO2 94%   BMI 24.37 kg/m   Physical Exam Vitals and nursing note reviewed.  Constitutional:      General: He is not in acute distress.    Appearance: Normal appearance. He is normal weight.  HENT:     Head: Normocephalic and atraumatic.     Comments: When I look at patient's face he has very minimal swelling around the right nasolabial fold and I palpate the area there is no localized induration or abscess felt.    Right Ear: External ear normal.     Left Ear: External ear normal.     Nose: Nose normal.     Mouth/Throat:     Mouth: Mucous membranes are moist.     Comments: Patient has multiple diffuse dental caries, the most severe area is his left upper mouth where they are almost rotted down to the gumline.  There is some redness over the gum of #7 which seems to be the source of this  infection. Eyes:     Extraocular Movements: Extraocular movements intact.     Conjunctiva/sclera: Conjunctivae normal.     Pupils: Pupils are equal, round, and reactive to light.  Cardiovascular:     Rate and Rhythm: Normal rate.  Pulmonary:     Effort: Pulmonary effort is normal. No respiratory distress.  Musculoskeletal:        General: Normal range of motion.     Cervical back: Normal range of motion.  Skin:    General: Skin is warm and dry.  Neurological:     General: No focal deficit present.     Mental Status: He is alert and oriented to person, place, and time.     Cranial Nerves: No cranial nerve deficit.  Psychiatric:        Mood and Affect: Mood normal.        Behavior: Behavior normal.        Thought Content: Thought content normal.     ED Results / Procedures / Treatments   Labs (all labs ordered are listed, but only abnormal results are displayed) Labs Reviewed - No data to  display  EKG None  Radiology No results found.  Procedures Procedures (including critical care time)  Medications Ordered in ED Medications  amoxicillin (AMOXIL) capsule 500 mg (has no administration in time range)    ED Course  I have reviewed the triage vital signs and the nursing notes.  Pertinent labs & imaging results that were available during my care of the patient were reviewed by me and considered in my medical decision making (see chart for details).    MDM Rules/Calculators/A&P                          Patient states he does not have insurance and he wants a cheaper antibiotic if possible.  He was started on amoxicillin.  He can take ibuprofen with acetaminophen for pain control.  We discussed seeing a dentist including going to a denture office to see about getting his teeth extracted.   Final Clinical Impression(s) / ED Diagnoses Final diagnoses:  Dental abscess  Facial swelling    Rx / DC Orders ED Discharge Orders         Ordered    amoxicillin (AMOXIL) 500 MG capsule  3 times daily        01/30/20 0627        OTC ibuprofen and acetaminophen  Plan discharge  Devoria Albe, MD, Concha Pyo, MD 01/30/20 7026    Devoria Albe, MD 01/30/20 [phone]

## 2020-01-30 NOTE — Discharge Instructions (Signed)
Take ibuprofen 800 mg plus acetaminophen 1000 mg every 6 hours as needed for pain.  Take the antibiotic until gone.  You need to see a dentist or an oral surgeon for definitive care.  You can check out the denture offices in [place], sometimes they will pull teeth cheaper than a regular dentist, however a lot of your teeth are rotted close to the gumline and you may need to see an oral surgeon to have them surgically removed.  Return to the emergency room if you get fever, worsening facial swelling, difficulty swallowing or breathing.

## 2020-01-30 NOTE — ED Triage Notes (Signed)
Pt with R sided facial swelling and pain x 3 days. States he has had some dental issues and "needs to see a dentist".

## 2020-08-09 IMAGING — CT CT ABD-PELV W/ CM
2 of 5 series · 16 of 46 positions shown, 18 images · IV contrast (Omnipaque or Isovue)
Comparison: CT abdomen pelvis dated 03/19/2004.

CLINICAL DATA: 43-year-old male with generalized abdominal pain.

EXAM:
CT ABDOMEN AND PELVIS WITH CONTRAST
TECHNIQUE: Multidetector CT imaging of the abdomen and pelvis was performed
using the standard protocol following bolus administration of
intravenous contrast.
CONTRAST:  100mL OMNIPAQUE IOHEXOL 300 MG/ML  SOLN

[Series 2: axial st · axial · 0.81mm/px · z∈[+756,+1170]mm · 13 of 97 slices shown, 15 images]
[im 7/97  soft-tissue]
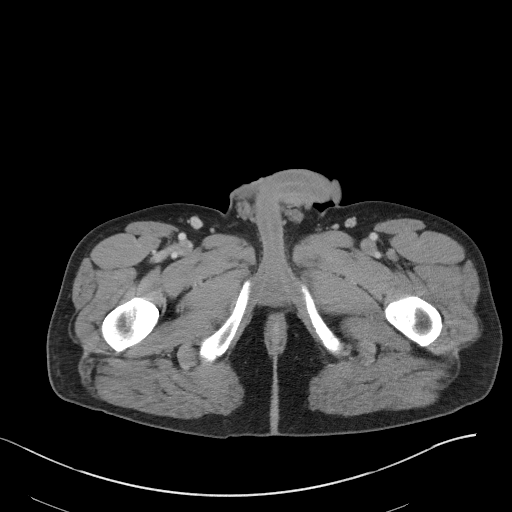
[im 7/97  bone]
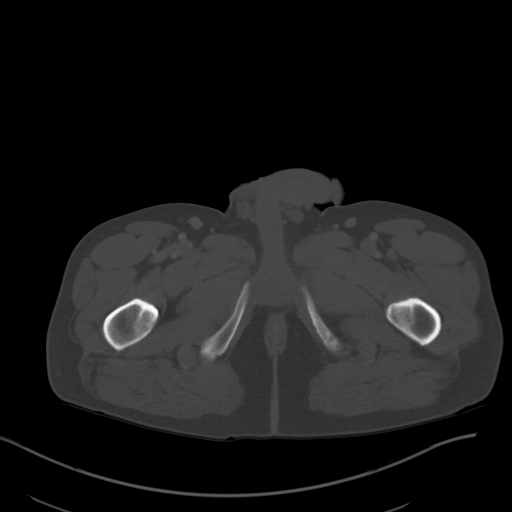
[im 13/97  soft-tissue]
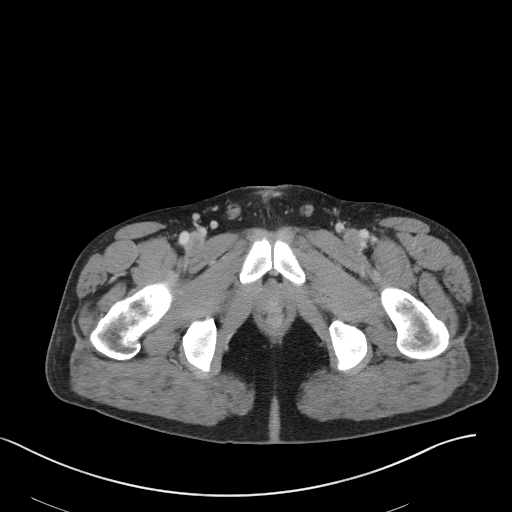
[im 20/97  soft-tissue]
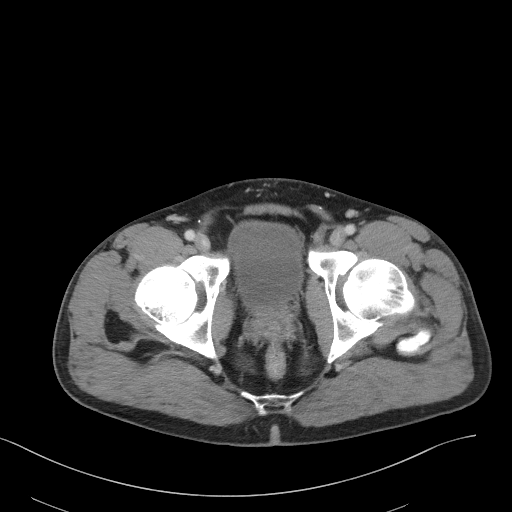
[im 26/97  soft-tissue]
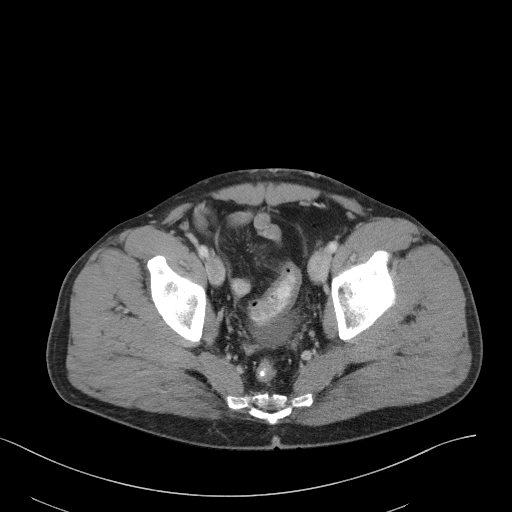
[im 33/97  soft-tissue]
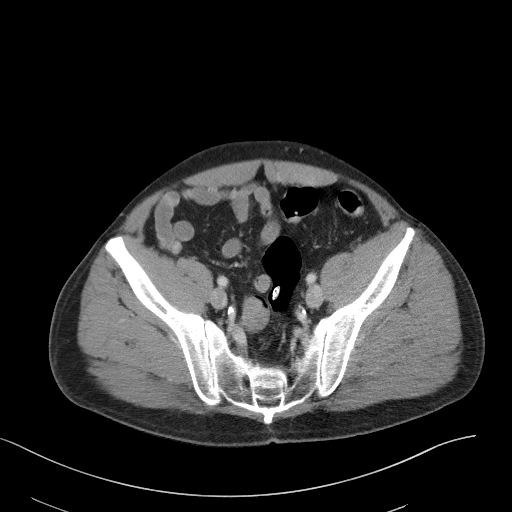
[im 39/97  soft-tissue]
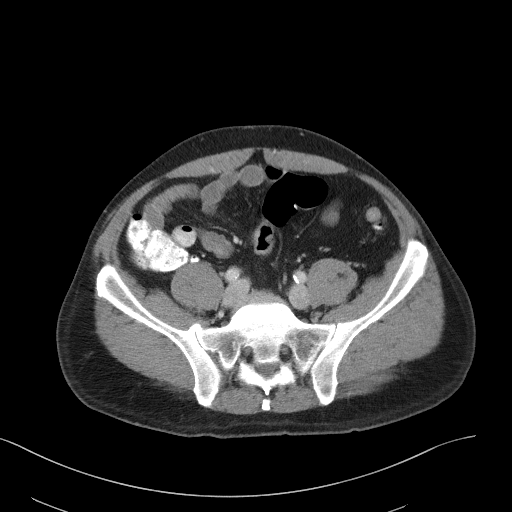
[im 52/97  soft-tissue]
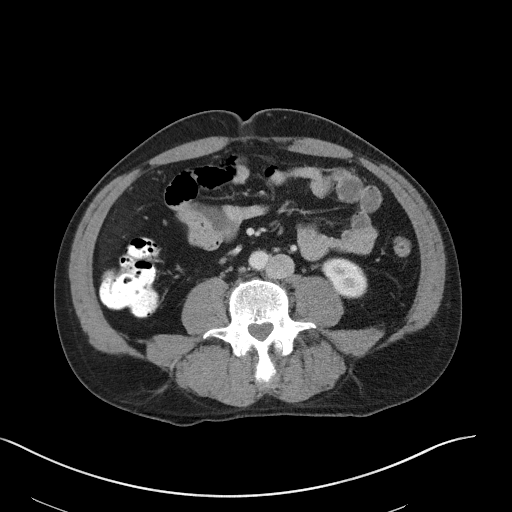
[im 58/97  soft-tissue]
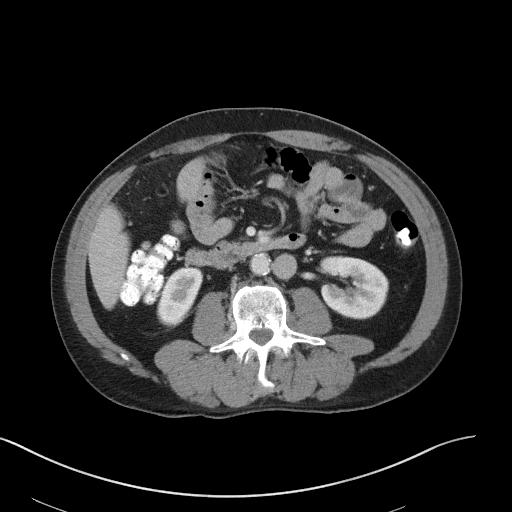
[im 65/97  soft-tissue]
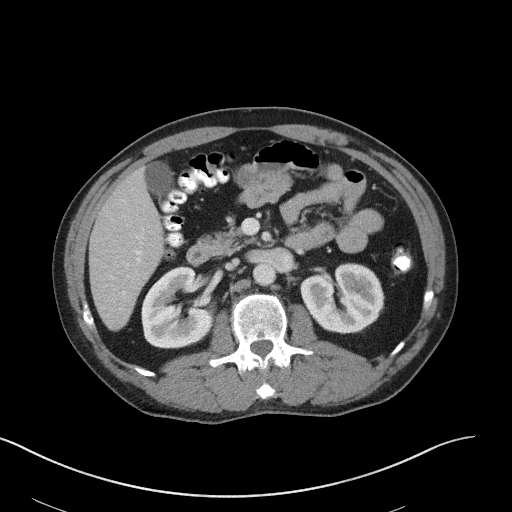
[im 65/97  bone]
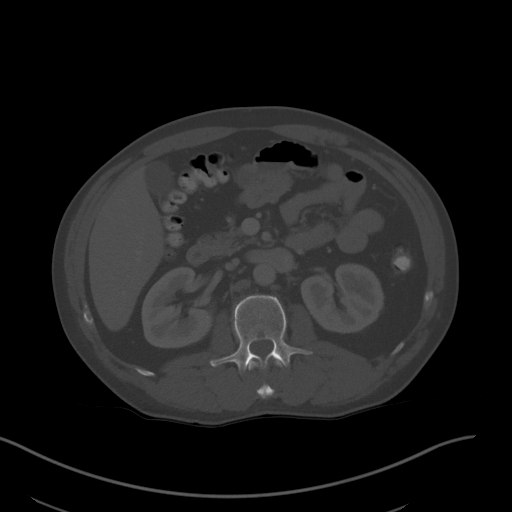
[im 71/97  soft-tissue]
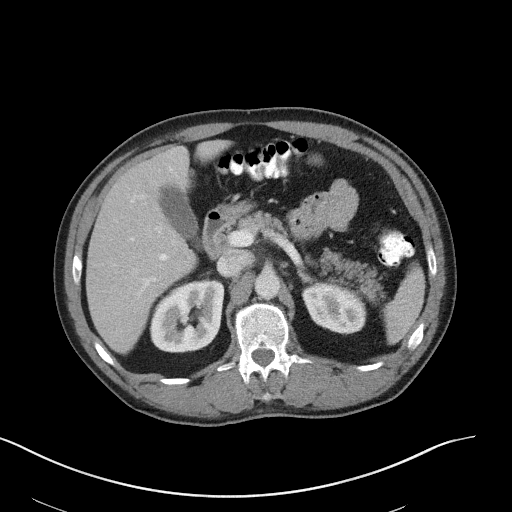
[im 77/97  soft-tissue]
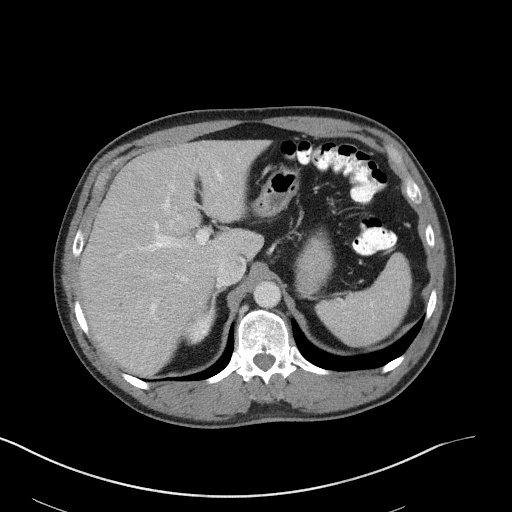
[im 84/97  soft-tissue]
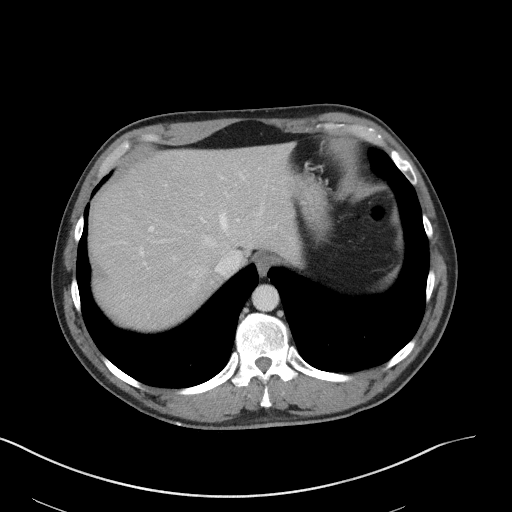
[im 90/97  soft-tissue]
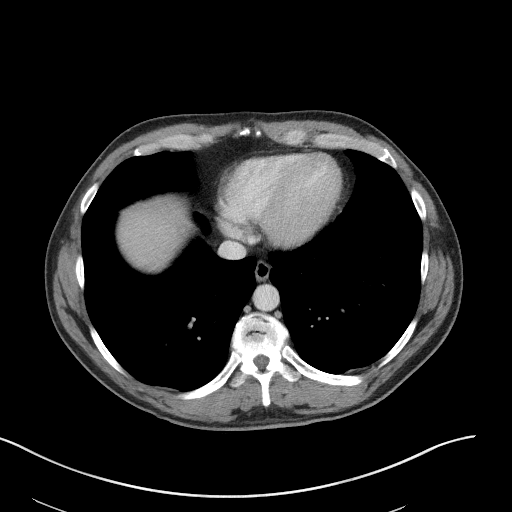

[Series 5: coronal st · coronal · 0.73mm/px · 3 of 117 slices shown]
[im 39/117  soft-tissue]
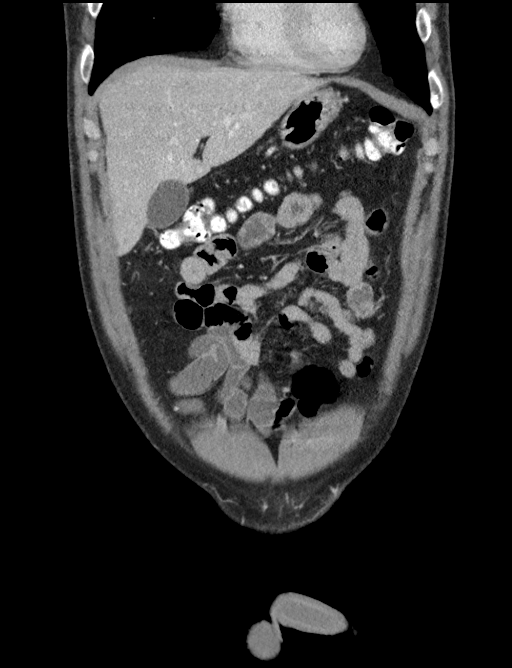
[im 52/117  soft-tissue]
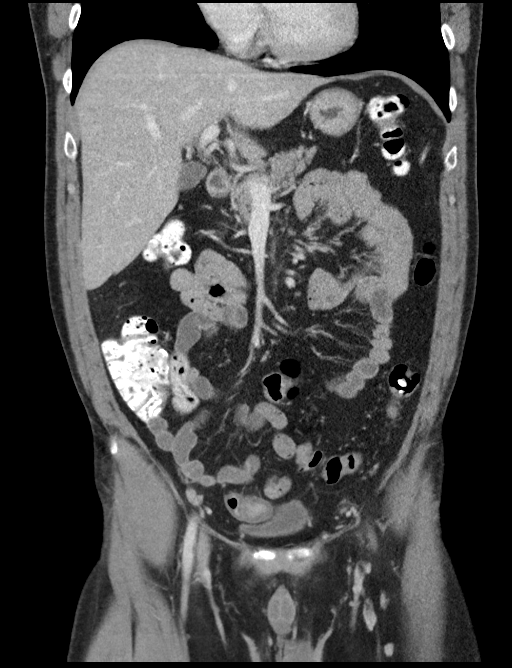
[im 65/117  soft-tissue]
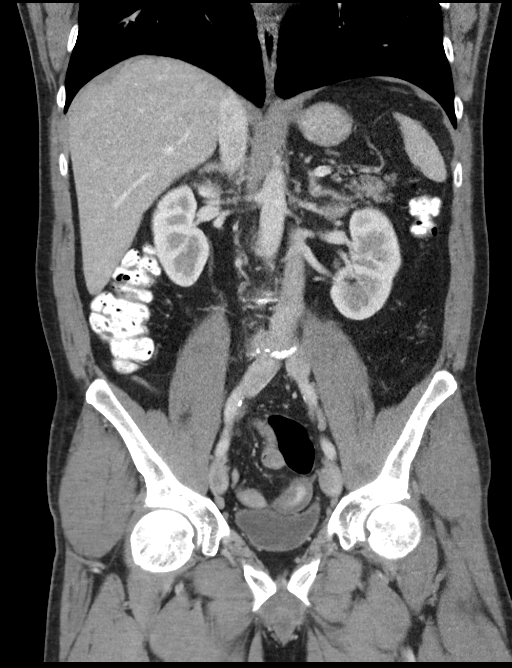

[16 of 46 positions shown; findings below may reference images not displayed]

FINDINGS: Lower chest: The visualized lung bases are clear.

No intra-abdominal free air or free fluid.

Hepatobiliary: Probable mild fatty liver. No intrahepatic biliary
ductal dilatation. The gallbladder is unremarkable.

Pancreas: Unremarkable. No pancreatic ductal dilatation or
surrounding inflammatory changes.

Spleen: Normal in size without focal abnormality.

Adrenals/Urinary Tract: The adrenal glands are unremarkable. There
is no hydronephrosis on either side. There is symmetric enhancement
and excretion of contrast by both kidneys. The visualized ureters
and urinary bladder appear unremarkable.

Stomach/Bowel: There is a thickened and inflamed loop of small bowel
in the pelvis consistent with enteritis which may be inflammatory or
infectious in etiology. Clinical correlation is recommended. There
is no bowel obstruction. The appendix is unremarkable as visualized.

Vascular/Lymphatic: There is a left-sided infrarenal IVC anatomy.
There is mild atherosclerotic calcification of the aorta. No portal
venous gas. There is no adenopathy.

Reproductive: The prostate and seminal vesicles are grossly
unremarkable.

Other: Small fat containing umbilical hernia

Musculoskeletal: Degenerative changes of the spine. No acute osseous
pathology.
IMPRESSION: 1. Enteritis of a segment of distal small bowel in the pelvis which
may be inflammatory or infectious in etiology. Clinical correlation
is recommended. No bowel obstruction. Normal appendix.
2. Aortic Atherosclerosis ([id number]).

## 2020-10-12 ENCOUNTER — Encounter (HOSPITAL_COMMUNITY): Payer: Self-pay | Admitting: Emergency Medicine

## 2020-10-12 ENCOUNTER — Other Ambulatory Visit: Payer: Self-pay

## 2020-10-12 ENCOUNTER — Emergency Department (HOSPITAL_COMMUNITY)
Admission: EM | Admit: 2020-10-12 | Discharge: 2020-10-12 | Disposition: A | Discharge status: Home / Self Care | Payer: Self-pay | Attending: Emergency Medicine | Admitting: Emergency Medicine

## 2020-10-12 DIAGNOSIS — Z5321 Procedure and treatment not carried out due to patient leaving prior to being seen by health care provider: Secondary | ICD-10-CM | POA: Insufficient documentation

## 2020-10-12 DIAGNOSIS — K047 Periapical abscess without sinus: Secondary | ICD-10-CM | POA: Insufficient documentation

## 2020-10-12 NOTE — ED Triage Notes (Signed)
Pt states he is having pain in the top of mouth from an infected tooth.

## 2021-01-19 ENCOUNTER — Telehealth (HOSPITAL_COMMUNITY): Payer: Self-pay | Admitting: Emergency Medicine

## 2021-01-19 ENCOUNTER — Other Ambulatory Visit: Payer: Self-pay

## 2021-01-19 ENCOUNTER — Encounter (HOSPITAL_COMMUNITY): Payer: Self-pay

## 2021-01-19 ENCOUNTER — Emergency Department (HOSPITAL_COMMUNITY)
Admission: EM | Admit: 2021-01-19 | Discharge: 2021-01-19 | Disposition: A | Discharge status: Home / Self Care | Payer: Self-pay | Attending: Emergency Medicine | Admitting: Emergency Medicine

## 2021-01-19 DIAGNOSIS — K047 Periapical abscess without sinus: Secondary | ICD-10-CM | POA: Insufficient documentation

## 2021-01-19 DIAGNOSIS — F17210 Nicotine dependence, cigarettes, uncomplicated: Secondary | ICD-10-CM | POA: Insufficient documentation

## 2021-01-19 MED ORDER — IBUPROFEN 800 MG PO TABS: 800.0000 mg | ORAL_TABLET | Freq: Four times a day (QID) | ORAL | 0 refills | Status: DC | PRN

## 2021-01-19 MED ORDER — AMOXICILLIN 500 MG PO CAPS: 1000.0000 mg | ORAL_CAPSULE | Freq: Two times a day (BID) | ORAL | 0 refills | Status: DC

## 2021-01-19 MED ORDER — AMOXICILLIN 250 MG PO CAPS
1000.0000 mg | ORAL_CAPSULE | Freq: Once | ORAL | Status: AC
  Administered 2021-01-19: 03:00:00 1000 mg via ORAL
  Filled 2021-01-19: qty 4

## 2021-01-19 MED ORDER — IBUPROFEN 800 MG PO TABS
800.0000 mg | ORAL_TABLET | Freq: Once | ORAL | Status: AC
  Administered 2021-01-19: 03:00:00 800 mg via ORAL
  Filled 2021-01-19: qty 1

## 2021-01-19 NOTE — ED Triage Notes (Signed)
Pt presents to ED with c/o toothache to left lower tooth, pt has swelling to jaw.

## 2021-01-19 NOTE — ED Provider Notes (Signed)
[facility] EMERGENCY DEPARTMENT Provider Note   CSN: [id number] Arrival date & time: 01/19/21  0100     History Chief Complaint  Patient presents with   Dental Pain    Jordan Garrett is a 45 y.o. male.  Patient presents to the emergency department for evaluation of dental pain and facial swelling.  Patient reports recurrent dental abscesses in the past with similar symptoms.  No fever, difficulty swallowing.      Past Medical History:  Diagnosis Date   ADD (attention deficit disorder)     There are no problems to display for this patient.   History reviewed. No pertinent surgical history.     No family history on file.  Social History   Tobacco Use   Smoking status: Every Day    Packs/day: 1.00    Years: 15.00    Pack years: 15.00    Types: Cigarettes   Smokeless tobacco: Never  Vaping Use   Vaping Use: Never used  Substance Use Topics   Alcohol use: No   Drug use: No    Home Medications Prior to Admission medications   Medication Sig Start Date End Date Taking? Authorizing Provider  amoxicillin (AMOXIL) 500 MG capsule Take 2 capsules (1,000 mg total) by mouth 2 (two) times daily. 01/19/21  Yes Tillman Kazmierski, Canary Brim, MD  ibuprofen (ADVIL) 800 MG tablet Take 1 tablet (800 mg total) by mouth every 6 (six) hours as needed for moderate pain. 01/19/21  Yes Hesham Womac, Canary Brim, MD  dicyclomine (BENTYL) 20 MG tablet Take 1 tablet (20 mg total) by mouth 3 (three) times daily before meals. 05/15/19   Gilda Crease, MD  pantoprazole (PROTONIX) 40 MG tablet Take 1 tablet (40 mg total) by mouth daily. 05/15/19   Gilda Crease, MD    Allergies    Patient has no known allergies.  Review of Systems   Review of Systems  Constitutional:  Negative for fever.  HENT:  Positive for dental problem.    Physical Exam Updated Vital Signs BP (!) 157/102    Pulse (!) 112    Temp 98.9 F (37.2 C) (Oral)    Resp 18    Ht 5\' 9"  (1.753 m)    Wt 75 kg     SpO2 99%    BMI 24.42 kg/m   Physical Exam Vitals and nursing note reviewed.  Constitutional:      General: He is not in acute distress.    Appearance: Normal appearance. He is well-developed.  HENT:     Head: Normocephalic and atraumatic.     Jaw: Tenderness and swelling (left side) present.     Right Ear: Hearing normal.     Left Ear: Hearing normal.     Nose: Nose normal.     Mouth/Throat:     Dentition: Abnormal dentition. Dental tenderness and dental caries present.  Eyes:     Conjunctiva/sclera: Conjunctivae normal.     Pupils: Pupils are equal, round, and reactive to light.  Cardiovascular:     Rate and Rhythm: Regular rhythm.     Heart sounds: S1 normal and S2 normal. No murmur heard.   No friction rub. No gallop.  Pulmonary:     Effort: Pulmonary effort is normal. No respiratory distress.     Breath sounds: Normal breath sounds.  Chest:     Chest wall: No tenderness.  Abdominal:     General: Bowel sounds are normal.     Palpations: Abdomen is soft.  Tenderness: There is no abdominal tenderness. There is no guarding or rebound. Negative signs include Murphy's sign and McBurney's sign.     Hernia: No hernia is present.  Musculoskeletal:        General: Normal range of motion.     Cervical back: Normal range of motion and neck supple.  Skin:    General: Skin is warm and dry.     Findings: No rash.  Neurological:     Mental Status: He is alert and oriented to person, place, and time.     GCS: GCS eye subscore is 4. GCS verbal subscore is 5. GCS motor subscore is 6.     Cranial Nerves: No cranial nerve deficit.     Sensory: No sensory deficit.     Coordination: Coordination normal.  Psychiatric:        Speech: Speech normal.        Behavior: Behavior normal.        Thought Content: Thought content normal.    ED Results / Procedures / Treatments   Labs (all labs ordered are listed, but only abnormal results are displayed) Labs Reviewed - No data to  display  EKG None  Radiology No results found.  Procedures Procedures   Medications Ordered in ED Medications  amoxicillin (AMOXIL) capsule 1,000 mg (has no administration in time range)  ibuprofen (ADVIL) tablet 800 mg (has no administration in time range)    ED Course  I have reviewed the triage vital signs and the nursing notes.  Pertinent labs & imaging results that were available during my care of the patient were reviewed by me and considered in my medical decision making (see chart for details).    MDM Rules/Calculators/A&P                         Presents with swelling on the left side of his jaw associated with dental caries.  Patient with recurrent dental abscess in the past.  He is healthy in appearance, no fever.  Floor of mouth is normal, no concerns for Ludewig's angina.  Treat with antibiotics, follow-up with dentist.    Final Clinical Impression(s) / ED Diagnoses Final diagnoses:  Dental abscess    Rx / DC Orders ED Discharge Orders          Ordered    amoxicillin (AMOXIL) 500 MG capsule  2 times daily        01/19/21 0232    ibuprofen (ADVIL) 800 MG tablet  Every 6 hours PRN        01/19/21 0232             Gilda Crease, MD 01/19/21 [phone]

## 2021-01-21 NOTE — Telephone Encounter (Signed)
Pharmacy changed

## 2021-09-13 DIAGNOSIS — R590 Localized enlarged lymph nodes: Secondary | ICD-10-CM | POA: Insufficient documentation

## 2021-09-13 DIAGNOSIS — K029 Dental caries, unspecified: Secondary | ICD-10-CM | POA: Insufficient documentation

## 2021-09-14 ENCOUNTER — Other Ambulatory Visit: Payer: Self-pay

## 2021-09-14 ENCOUNTER — Emergency Department (HOSPITAL_COMMUNITY)
Admission: EM | Admit: 2021-09-14 | Discharge: 2021-09-14 | Disposition: A | Discharge status: Home / Self Care | Payer: Self-pay | Attending: Emergency Medicine | Admitting: Emergency Medicine

## 2021-09-14 ENCOUNTER — Encounter (HOSPITAL_COMMUNITY): Payer: Self-pay

## 2021-09-14 DIAGNOSIS — K029 Dental caries, unspecified: Secondary | ICD-10-CM

## 2021-09-14 DIAGNOSIS — R591 Generalized enlarged lymph nodes: Secondary | ICD-10-CM

## 2021-09-14 MED ORDER — CLINDAMYCIN HCL 300 MG PO CAPS: 300.0000 mg | ORAL_CAPSULE | Freq: Three times a day (TID) | ORAL | 0 refills | Status: DC

## 2021-09-14 NOTE — ED Provider Notes (Signed)
  [facility] EMERGENCY DEPARTMENT Provider Note   CSN: [id number] Arrival date & time: 09/13/21  2318     History  Chief Complaint  Patient presents with   Abscess    Left axilla    Jordan Garrett is a 46 y.o. male.  The history is provided by the patient.   Patient presents with 2 complaints  1.  He reports dental pain for several days.  He reports he has been unable to follow-up with a dentist  2.  He reports swelling and pain in his left axilla.  No fevers or vomiting.  Has never had this before.  No other acute complaints    Home Medications Prior to Admission medications   Medication Sig Start Date End Date Taking? Authorizing Provider  clindamycin (CLEOCIN) 300 MG capsule Take 1 capsule (300 mg total) by mouth 3 (three) times daily. 09/14/21  Yes Zadie Rhine, MD      Allergies    Patient has no known allergies.    Review of Systems   Review of Systems  Physical Exam Updated Vital Signs BP 128/88 (BP Location: Right Arm)   Pulse 78   Temp 97.8 F (36.6 C) (Oral)   Resp 18   Ht 1.778 m (5\' 10" )   Wt 61.2 kg   SpO2 99%   BMI 19.37 kg/m  Physical Exam CONSTITUTIONAL: Well developed/well nourished HEAD: Normocephalic/atraumatic EYES: EOMI/PERRL ENMT: Mucous membranes moist, no trismus, no drooling, no stridor.  Widespread dental decay is noted.  No obvious abscess. NECK: supple no meningeal signs, minimal bilateral cervical adenopathy NEURO: Pt is awake/alert/appropriate, moves all extremitiesx4.  No facial droop.   EXTREMITIES:Left axillary lymphadenopathy noted.  No erythema, no abscess, no fluctuance. No lymphadenopathy to the right axilla No supraclavicular lymphadenopathy SKIN: warm, color normal PSYCH: no abnormalities of mood noted, alert and oriented to situation  ED Results / Procedures / Treatments   Labs (all labs ordered are listed, but only abnormal results are displayed) Labs Reviewed - No data to  display  EKG None  Radiology No results found.  Procedures Procedures    Medications Ordered in ED Medications - No data to display  ED Course/ Medical Decision Making/ A&P                           Medical Decision Making Risk Prescription drug management.   Will  patient on clindamycin for his dental caries as well as lymphadenopathy. Unclear cause of the axillary lymphadenopathy.  Advised that there is no improvement after antibiotics he will need further work-up including labs and primary care evaluation.  He may also need to undergo a cancer work-up.  Patient understands this and will establish with a PCP        Final Clinical Impression(s) / ED Diagnoses Final diagnoses:  Dental caries  Lymphadenopathy    Rx / DC Orders ED Discharge Orders          Ordered    clindamycin (CLEOCIN) 300 MG capsule  3 times daily        09/14/21 0209              [id number], MD 09/14/21 [phone]

## 2021-09-14 NOTE — ED Triage Notes (Signed)
Pt presents to ED with c/o abscess to left axilla that started yesterday, pt also c/o dental pain.

## 2021-09-21 ENCOUNTER — Other Ambulatory Visit: Payer: Self-pay

## 2021-09-21 ENCOUNTER — Emergency Department (HOSPITAL_COMMUNITY)
Admission: EM | Admit: 2021-09-21 | Discharge: 2021-09-21 | Disposition: A | Discharge status: Home / Self Care | Payer: Self-pay | Attending: Emergency Medicine | Admitting: Emergency Medicine

## 2021-09-21 ENCOUNTER — Encounter (HOSPITAL_COMMUNITY): Payer: Self-pay

## 2021-09-21 DIAGNOSIS — Z760 Encounter for issue of repeat prescription: Secondary | ICD-10-CM | POA: Insufficient documentation

## 2021-09-21 DIAGNOSIS — R590 Localized enlarged lymph nodes: Secondary | ICD-10-CM | POA: Insufficient documentation

## 2021-09-21 DIAGNOSIS — K0889 Other specified disorders of teeth and supporting structures: Secondary | ICD-10-CM | POA: Insufficient documentation

## 2021-09-21 MED ORDER — CLINDAMYCIN HCL 300 MG PO CAPS: 300.0000 mg | ORAL_CAPSULE | Freq: Three times a day (TID) | ORAL | 0 refills | Status: DC

## 2021-09-21 NOTE — ED Provider Notes (Signed)
[facility] EMERGENCY DEPARTMENT Provider Note   CSN: [id number] Arrival date & time: 09/21/21  1925     History  No chief complaint on file.   Jordan Garrett is a 46 y.o. male.  HPI   Patient presents due to request for medication refill.  Patient was seen about a week ago in the ED, having lymphadenopathy under his left axilla.  Also was complaining of dental pain.  Started on clindamycin, states the lymphadenopathy is almost resolved but is still present.  He is here requesting an extension of the clindamycin.    He denies feeling short of breath, chest pain, unintentional weight loss, malignancy history, fevers, chills.  No history of malignancy.  He is unable to establish with a primary care physician, has financial limitations  Home Medications Prior to Admission medications   Medication Sig Start Date End Date Taking? Authorizing Provider  clindamycin (CLEOCIN) 300 MG capsule Take 1 capsule (300 mg total) by mouth 3 (three) times daily. 09/21/21   Theron Arista, PA-C      Allergies    Patient has no known allergies.    Review of Systems   Review of Systems  Hematological:  Positive for adenopathy.    Physical Exam Updated Vital Signs BP 128/88 (BP Location: Right Arm)   Pulse 88   Temp 98.1 F (36.7 C) (Oral)   Resp 18   Ht 5\' 10"  (1.778 m)   Wt 74.8 kg   SpO2 100%   BMI 23.68 kg/m  Physical Exam Vitals and nursing note reviewed. Exam conducted with a chaperone present.  Constitutional:      Appearance: Normal appearance.  HENT:     Head: Normocephalic and atraumatic.     Mouth/Throat:     Comments: Poor dentition Eyes:     General: No scleral icterus.       Right eye: No discharge.        Left eye: No discharge.     Extraocular Movements: Extraocular movements intact.     Pupils: Pupils are equal, round, and reactive to light.  Cardiovascular:     Rate and Rhythm: Normal rate and regular rhythm.     Pulses: Normal pulses.     Heart sounds:  Normal heart sounds. No murmur heard.    No friction rub. No gallop.  Pulmonary:     Effort: Pulmonary effort is normal. No respiratory distress.     Breath sounds: Normal breath sounds.  Abdominal:     General: Abdomen is flat. Bowel sounds are normal. There is no distension.     Palpations: Abdomen is soft.     Tenderness: There is no abdominal tenderness.  Lymphadenopathy:     Upper Body:     Left upper body: Axillary adenopathy present.     Comments: Mild lymphadenopathy to left axilla. Less than 2 cm.  Skin:    General: Skin is warm and dry.     Coloration: Skin is not jaundiced.  Neurological:     Mental Status: He is alert. Mental status is at baseline.     Coordination: Coordination normal.     ED Results / Procedures / Treatments   Labs (all labs ordered are listed, but only abnormal results are displayed) Labs Reviewed - No data to display  EKG None  Radiology No results found.  Procedures Procedures    Medications Ordered in ED Medications - No data to display  ED Course/ Medical Decision Making/ A&P  Medical Decision Making Risk Prescription drug management.   This is a 46 year old male presenting due to mild lymphadenopathy to the left axilla and request for medication refill.  He was seen a week ago, had left axilla lymphadenopathy.  Also complaining of dental pain.  Started on clindamycin, states she has had significant improvement but the lymph node has not fully resolved.  He is not unable to follow-up with a PCP due to financial restraint.  This unfortunately is a social determinant of his health.  I consider getting laboratory work-up for unresolved lymphadenopathy, however patient does not wish to stay in the ED.  He is requesting refill of the clindamycin which I think is reasonable.  I did put in a social work consult to hopefully reach out to patient and assist with PCP needs.  [facility]  Clindamycin refilled, patient understands that should lymphadenopathy not fully resolve he will need to get follow-up from his PCP that he should have PCP follow-up regardless.        Final Clinical Impression(s) / ED Diagnoses Final diagnoses:  Medication refill    Rx / DC Orders ED Discharge Orders          Ordered    clindamycin (CLEOCIN) 300 MG capsule  3 times daily        09/21/21 2004              Theron Arista, [place] 09/21/21 2238    Pricilla Loveless, MD 09/24/21 1626

## 2021-09-21 NOTE — ED Triage Notes (Signed)
Pt presents wanting a refill of clindamycin for his infected lymph node. Pt states his doctor told him to come back for a second round. States that he feels like it has gotten better.

## 2021-09-21 NOTE — Discharge Instructions (Signed)
Take the clindamycin as prescribed for the next week.  If you are still having lymphadenopathy" you will need to come and get laboratory work-up and additional work-up with the primary.  I put in a social work consult, they should reach out to schedule follow-up.  Also provide information above for group at [facility] who will see patients with or without insurance.  Please call and schedule an appointment to establish care.  Return to the ED for new or different symptoms.

## 2022-03-01 ENCOUNTER — Encounter (HOSPITAL_COMMUNITY): Payer: Self-pay

## 2022-03-01 ENCOUNTER — Emergency Department (HOSPITAL_COMMUNITY)
Admission: EM | Admit: 2022-03-01 | Discharge: 2022-03-01 | Disposition: A | Discharge status: Home / Self Care | Payer: Self-pay | Attending: Student | Admitting: Student

## 2022-03-01 ENCOUNTER — Other Ambulatory Visit: Payer: Self-pay

## 2022-03-01 DIAGNOSIS — K029 Dental caries, unspecified: Secondary | ICD-10-CM | POA: Insufficient documentation

## 2022-03-01 MED ORDER — AMOXICILLIN-POT CLAVULANATE 875-125 MG PO TABS: 1.0000 | ORAL_TABLET | Freq: Two times a day (BID) | ORAL | 0 refills | Status: DC

## 2022-03-01 MED ORDER — BENZOCAINE 10 % MT GEL
Freq: Once | OROMUCOSAL | Status: AC
  Filled 2022-03-01: qty 9

## 2022-03-01 MED ORDER — AMOXICILLIN-POT CLAVULANATE 875-125 MG PO TABS
1.0000 | ORAL_TABLET | Freq: Once | ORAL | Status: AC
  Administered 2022-03-01: 1 via ORAL
  Filled 2022-03-01: qty 1

## 2022-03-01 NOTE — ED Triage Notes (Signed)
Pt reports left upper and lower tooth pain and swelling x 2 weeks.

## 2022-03-01 NOTE — ED Provider Notes (Signed)
[facility] Provider Note   CSN: [id number] Arrival date & time: 03/01/22  2033     History  Chief Complaint  Patient presents with   Dental Pain    Jordan Garrett is a 47 y.o. male who presents to the Emergency Department complaining of left sided dental pain onset 2 weeks. Notes this has been ongoing x 1 year. No dentist at this time. He tried orajel and ibuprofen with no relief for his symptoms. He denies gum swelling/bleeding, fever, chills, drainage, sore throat.  The history is provided by the patient. No language interpreter was used.       Home Medications Prior to Admission medications   Medication Sig Start Date End Date Taking? Authorizing Provider  amoxicillin-clavulanate (AUGMENTIN) 875-125 MG tablet Take 1 tablet by mouth every 12 (twelve) hours. 03/01/22  Yes Taline Nass A, PA-C  clindamycin (CLEOCIN) 300 MG capsule Take 1 capsule (300 mg total) by mouth 3 (three) times daily. 09/21/21   Sherrill Raring, PA-C      Allergies    Patient has no known allergies.    Review of Systems   Review of Systems  All other systems reviewed and are negative.   Physical Exam Updated Vital Signs BP 138/80 (BP Location: Right Arm)   Pulse 90   Temp 98.1 F (36.7 C) (Oral)   Resp 16   Ht 5\' 9"  (1.753 m)   Wt 74.8 kg   SpO2 99%   BMI 24.37 kg/m  Physical Exam Vitals and nursing note reviewed.  Constitutional:      General: He is not in acute distress.    Appearance: He is not ill-appearing.  HENT:     Head: Normocephalic and atraumatic.     Right Ear: External ear normal.     Left Ear: External ear normal.     Nose: Nose normal.     Mouth/Throat:     Mouth: Mucous membranes are moist. Mucous membranes are not dry.     Dentition: Abnormal dentition. Dental tenderness and dental caries present.     Tongue: Tongue does not deviate from midline.     Pharynx: Oropharynx is clear. Uvula midline. No oropharyngeal exudate or  posterior oropharyngeal erythema.     Tonsils: No tonsillar exudate or tonsillar abscesses.     Comments: Multiple dental caries noted throughout. Tenderness to palpation to left upper and lower gum line. No fluctuance noted. No trismus. No retropharyngeal abscess. No peritonsillar abscess noted. Uvula midline without swelling. No posterior pharyngeal erythema or tonsillar exudate noted. Able to speak in clear complete sentences.  Eyes:     Extraocular Movements: Extraocular movements intact.  Cardiovascular:     Rate and Rhythm: Normal rate and regular rhythm.     Pulses: Normal pulses.     Heart sounds: Normal heart sounds.  Pulmonary:     Effort: Pulmonary effort is normal. No respiratory distress.     Breath sounds: Normal breath sounds.  Abdominal:     General: Bowel sounds are normal. There is no distension.     Palpations: Abdomen is soft. There is no mass.     Tenderness: There is no abdominal tenderness.  Musculoskeletal:        General: Normal range of motion.     Cervical back: Neck supple.  Lymphadenopathy:     Head:     Right side of head: No submental, submandibular, tonsillar, preauricular or posterior auricular adenopathy.  Left side of head: No submental, submandibular, tonsillar, preauricular or posterior auricular adenopathy.     Cervical: No cervical adenopathy.  Skin:    General: Skin is warm and dry.     Findings: No rash.  Neurological:     Mental Status: He is alert.  Psychiatric:        Behavior: Behavior normal.     ED Results / Procedures / Treatments   Labs (all labs ordered are listed, but only abnormal results are displayed) Labs Reviewed - No data to display  EKG None  Radiology No results found.  Procedures Procedures    Medications Ordered in ED Medications  amoxicillin-clavulanate (AUGMENTIN) 875-125 MG per tablet 1 tablet (1 tablet Oral Given 03/01/22 2224)  benzocaine (ORAJEL) 10 % mucosal gel ( Mouth/Throat Given 03/01/22 2227)     ED Course/ Medical Decision Making/ A&P                             Medical Decision Making Risk OTC drugs. Prescription drug management.   Patient presents to the ED with left sided dental pain onset 2 weeks.  Patient does not have a dentist at this time.  Pt afebrile. On exam patient with Multiple dental caries noted throughout. Tenderness to palpation to left upper and lower gum line. No fluctuance noted. No trismus. No retropharyngeal abscess. No peritonsillar abscess noted. Uvula midline without swelling. No posterior pharyngeal erythema or tonsillar exudate noted. Able to speak in clear complete sentences. Differential diagnosis includes pharyngeal abscess, dental abscess, Ludwig's angina.   Medications:  I ordered medication including augmentin and orajel for antibiotic first dose Reevaluation of the patient after these medicines and interventions, I reevaluated the patient and found that they have improved I have reviewed the patients home medicines and have made adjustments as needed   Disposition: Patient presentation suspicious for dentalgia.  No abscess requiring immediate incision and drainage.  Exam not concerning for Ludwig's angina or pharyngeal abscess. After consideration of the diagnostic results and the patients response to treatment, I feel that the patient would benefit from Discharge home. Will treat with Augmentin prescription.  Provided resource guide for dentists in the area, patient structured to follow-up with a dentist. Supportive care measures and strict return precautions discussed with patient at bedside. Pt acknowledges and verbalizes understanding. Pt appears safe for discharge. Follow up as indicated in discharge paperwork.  This chart was dictated using voice recognition software, Dragon. Despite the best efforts of this provider to proofread and correct errors, errors may still occur which can change documentation meaning.  Final Clinical  Impression(s) / ED Diagnoses Final diagnoses:  Pain due to dental caries    Rx / DC Orders ED Discharge Orders          Ordered    amoxicillin-clavulanate (AUGMENTIN) 875-125 MG tablet  Every 12 hours        03/01/22 2232              Aser Nylund A, PA-C 03/01/22 2304    Teressa Lower, MD 03/02/22 1248

## 2022-03-01 NOTE — Discharge Instructions (Signed)
It was a pleasure taking care of you today!   You will be prescribed Augmentin, take as directed and ensure to complete the entire course of the antibiotic. You may take over the counter 600 mg Ibuprofen every 6 hours or 1,000 mg Tylenol every 6 hours as directed.  Attached you will find a resource guide for dentists in the area, call and set up a follow up appointment. Return to the Emergency Department if you are experiencing trouble breathing, trouble swallowing, decreased fluid intake, or worsening symptoms.

## 2022-06-29 ENCOUNTER — Encounter (HOSPITAL_COMMUNITY): Payer: Self-pay

## 2022-06-29 ENCOUNTER — Other Ambulatory Visit: Payer: Self-pay

## 2022-06-29 ENCOUNTER — Emergency Department (HOSPITAL_COMMUNITY)
Admission: EM | Admit: 2022-06-29 | Discharge: 2022-06-29 | Disposition: A | Discharge status: Home / Self Care | Payer: Self-pay | Attending: Emergency Medicine | Admitting: Emergency Medicine

## 2022-06-29 DIAGNOSIS — K0889 Other specified disorders of teeth and supporting structures: Secondary | ICD-10-CM

## 2022-06-29 DIAGNOSIS — K029 Dental caries, unspecified: Secondary | ICD-10-CM | POA: Insufficient documentation

## 2022-06-29 MED ORDER — AMOXICILLIN-POT CLAVULANATE 875-125 MG PO TABS
1.0000 | ORAL_TABLET | Freq: Once | ORAL | Status: AC
  Administered 2022-06-29: 1 via ORAL
  Filled 2022-06-29: qty 1

## 2022-06-29 MED ORDER — AMOXICILLIN-POT CLAVULANATE 875-125 MG PO TABS: 1.0000 | ORAL_TABLET | Freq: Two times a day (BID) | ORAL | 0 refills | Status: AC

## 2022-06-29 MED ORDER — IBUPROFEN 400 MG PO TABS
600.0000 mg | ORAL_TABLET | Freq: Once | ORAL | Status: AC
  Administered 2022-06-29: 600 mg via ORAL
  Filled 2022-06-29: qty 2

## 2022-06-29 MED ORDER — IBUPROFEN 600 MG PO TABS: 600.0000 mg | ORAL_TABLET | Freq: Four times a day (QID) | ORAL | 0 refills | Status: AC | PRN

## 2022-06-29 NOTE — ED Provider Notes (Signed)
[place] EMERGENCY DEPARTMENT AT [facility]  Provider Note  CSN: [id number] Arrival date & time: 06/29/22 0330  History Chief Complaint  Patient presents with   Dental Pain    Jordan Garrett is a 47 y.o. male with poor dentition and frequent ED visits for toothache here for same. Tonight he is complaining of pain in L lower molar area. He has not had any fever or drainage.    Home Medications Prior to Admission medications   Medication Sig Start Date End Date Taking? Authorizing Provider  ibuprofen (ADVIL) 600 MG tablet Take 1 tablet (600 mg total) by mouth every 6 (six) hours as needed. 06/29/22  Yes Pollyann Savoy, MD  amoxicillin-clavulanate (AUGMENTIN) 875-125 MG tablet Take 1 tablet by mouth 2 (two) times daily for 7 days. 06/29/22 07/06/22  Pollyann Savoy, MD     Allergies    Patient has no known allergies.   Review of Systems   Review of Systems Please see HPI for pertinent positives and negatives  Physical Exam BP (!) 157/106 (BP Location: Left Arm)   Pulse 97   Temp 97.7 F (36.5 C) (Oral)   Resp 18   Ht 5\' 9"  (1.753 m)   Wt 74.8 kg   SpO2 100%   BMI 24.37 kg/m   Physical Exam Vitals and nursing note reviewed.  HENT:     Head: Normocephalic.     Nose: Nose normal.     Mouth/Throat:     Comments: Poor dentition, multiple teeth eroded down to the gums, large dental caries in L lower 2nd molar as the site of tonight's pain. No signs of abscess or deep tissue infection.  Eyes:     Extraocular Movements: Extraocular movements intact.  Pulmonary:     Effort: Pulmonary effort is normal.  Musculoskeletal:        General: Normal range of motion.     Cervical back: Neck supple.  Skin:    Findings: No rash (on exposed skin).  Neurological:     Mental Status: He is alert and oriented to person, place, and time.  Psychiatric:        Mood and Affect: Mood normal.     ED Results / Procedures / Treatments    EKG None  Procedures Procedures  Medications Ordered in the ED Medications  amoxicillin-clavulanate (AUGMENTIN) 875-125 MG per tablet 1 tablet (1 tablet Oral Given 06/29/22 0353)  ibuprofen (ADVIL) tablet 600 mg (600 mg Oral Given 06/29/22 0353)    Initial Impression and Plan  Patient here for recurrent toothache. Advised he will need extensive dental work, likely with oral surgery, to repair his damage. Will give Rx for Abx and NSAIDs for pain. He will call a dentist to arrange follow up.   ED Course       MDM Rules/Calculators/A&P Medical Decision Making Problems Addressed: Toothache: chronic illness or injury with exacerbation, progression, or side effects of treatment  Risk Prescription drug management.     Final Clinical Impression(s) / ED Diagnoses Final diagnoses:  Toothache    Rx / DC Orders ED Discharge Orders          Ordered    amoxicillin-clavulanate (AUGMENTIN) 875-125 MG tablet  2 times daily        06/29/22 0354    ibuprofen (ADVIL) 600 MG tablet  Every 6 hours PRN        06/29/22 0354             Bernette Mayers,  Bonnita Levan, MD 06/29/22 [phone]

## 2022-06-29 NOTE — ED Triage Notes (Signed)
Pt arrived via POV c/o left lower dental pain where pt shows previously broken teeth and possible infection. Pt reports pain has been worsening and is shooting to his ear.

## 2022-06-29 NOTE — ED Notes (Signed)
ED Provider at bedside. 

## 2022-06-29 NOTE — ED Notes (Signed)
Pt reports last taking 800mg  Ibuprofen at midnight PTA.

## 2022-07-26 ENCOUNTER — Other Ambulatory Visit: Payer: Self-pay

## 2022-07-26 ENCOUNTER — Encounter (HOSPITAL_COMMUNITY): Payer: Self-pay

## 2022-07-26 ENCOUNTER — Emergency Department (HOSPITAL_COMMUNITY): Payer: Self-pay

## 2022-07-26 ENCOUNTER — Emergency Department (HOSPITAL_COMMUNITY)
Admission: EM | Admit: 2022-07-26 | Discharge: 2022-07-26 | Disposition: A | Discharge status: Home / Self Care | Payer: Self-pay | Attending: Emergency Medicine | Admitting: Emergency Medicine

## 2022-07-26 DIAGNOSIS — M5441 Lumbago with sciatica, right side: Secondary | ICD-10-CM | POA: Insufficient documentation

## 2022-07-26 DIAGNOSIS — F17210 Nicotine dependence, cigarettes, uncomplicated: Secondary | ICD-10-CM | POA: Insufficient documentation

## 2022-07-26 MED ORDER — PREDNISONE 20 MG PO TABS: 40.0000 mg | ORAL_TABLET | Freq: Every day | ORAL | 0 refills | Status: AC

## 2022-07-26 MED ORDER — ACETAMINOPHEN 500 MG PO TABS
1000.0000 mg | ORAL_TABLET | Freq: Once | ORAL | Status: AC
  Administered 2022-07-26: 1000 mg via ORAL
  Filled 2022-07-26: qty 2

## 2022-07-26 MED ORDER — PREDNISONE 50 MG PO TABS
60.0000 mg | ORAL_TABLET | Freq: Once | ORAL | Status: AC
  Administered 2022-07-26: 60 mg via ORAL
  Filled 2022-07-26: qty 1

## 2022-07-26 NOTE — ED Provider Notes (Signed)
AP-EMERGENCY DEPT [facility] Emergency Department Provider Note MRN:  [id number]  Arrival date & time: 07/26/22     Chief Complaint   Back Pain   History of Present Illness   Jordan Garrett is a 47 y.o. year-old male with no pertinent past medical history presenting to the ED with chief complaint of back pain.  Pain in the right lower back/right hip for the past month or more.  Has had some lingering on and off back pain for a while ever since he had a fall years ago.  Worse over the past few weeks.  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  Denies numbness or weakness to the arms or legs, no bowel or bladder dysfunction.  Has paresthesias shooting down the right leg.  Review of Systems  A thorough review of systems was obtained and all systems are negative except as noted in the HPI and PMH.   Patient's Health History    Past Medical History:  Diagnosis Date   ADD (attention deficit disorder)     History reviewed. No pertinent surgical history.  History reviewed. No pertinent family history.  Social History   Socioeconomic History   Marital status: Married    Spouse name: Not on file   Number of children: Not on file   Years of education: Not on file   Highest education level: Not on file  Occupational History   Not on file  Tobacco Use   Smoking status: Every Day    Packs/day: 1.00    Years: 15.00    Additional pack years: 0.00    Total pack years: 15.00    Types: Cigarettes   Smokeless tobacco: Never  Vaping Use   Vaping Use: Never used  Substance and Sexual Activity   Alcohol use: No   Drug use: No   Sexual activity: Not Currently  Other Topics Concern   Not on file  Social History Narrative   Not on file   Social Determinants of Health   Financial Resource Strain: Not on file  Food Insecurity: Not on file  Transportation Needs: Not on file  Physical Activity: Not on file  Stress: Not on file  Social Connections: Not on file   Intimate Partner Violence: Not on file     Physical Exam   Vitals:   07/26/22 0313  BP: (!) 150/123  Pulse: (!) 105  Resp: 17  Temp: 97.7 F (36.5 C)  SpO2: 100%    CONSTITUTIONAL: Well-appearing, NAD NEURO/PSYCH:  Alert and oriented x 3, no focal deficits EYES:  eyes equal and reactive ENT/NECK:  no LAD, no JVD CARDIO: Regular rate, well-perfused, normal S1 and S2 PULM:  CTAB no wheezing or rhonchi GI/GU:  non-distended, non-tender MSK/SPINE:  No gross deformities, no edema SKIN:  no rash, atraumatic   *Additional and/or pertinent findings included in MDM below  Diagnostic and Interventional Summary    EKG Interpretation Date/Time:    Ventricular Rate:    PR Interval:    QRS Duration:    QT Interval:    QTC Calculation:   R Axis:      Text Interpretation:         Labs Reviewed - No data to display  DG Lumbar Spine Complete  Final Result    DG Hip Unilat W or Wo Pelvis 2-3 Views Right  Final Result      Medications  predniSONE (DELTASONE) tablet 60 mg (has no administration in time range)  acetaminophen (TYLENOL) tablet  1,000 mg (has no administration in time range)     Procedures  /  Critical Care Procedures  ED Course and Medical Decision Making  Initial Impression and Ddx Differential diagnosis includes osteoarthritis, lumbar stenosis, MSK strain or sprain, radiculopathy, hip arthritis, sciatica, fracture  Past medical/surgical history that increases complexity of ED encounter: None  Interpretation of Diagnostics I personally reviewed the lumbar x-ray and my interpretation is as follows: No fracture.  Hip x-ray also normal    Patient Reassessment and Ultimate Disposition/Management     No signs or symptoms of myelopathy, nothing to suggest emergent process, appropriate for discharge with return precautions.  Patient management required discussion with the following services or consulting groups:  None  Complexity of Problems  Addressed Acute complicated illness or Injury  Additional Data Reviewed and Analyzed Further history obtained from: None  Additional Factors Impacting ED Encounter Risk Prescriptions  Elmer Sow. Pilar Plate, MD [facility] Emergency Medicine [facility] [email or web address]  Final Clinical Impressions(s) / ED Diagnoses     ICD-10-CM   1. Acute right-sided low back pain with right-sided sciatica  M54.41       ED Discharge Orders          Ordered    predniSONE (DELTASONE) 20 MG tablet  Daily        07/26/22 0401             Discharge Instructions Discussed with and Provided to Patient:     Discharge Instructions      You were evaluated in the Emergency Department and after careful evaluation, we did not find any emergent condition requiring admission or further testing in the hospital.  Your exam/testing today is overall reassuring.  X-rays did not show any abnormalities.  Take the prednisone as prescribed starting morning of July 1.  Recommend follow-up with a spine expert if not improving over the next 2 weeks.  Please return to the Emergency Department if you experience any worsening of your condition.   Thank you for allowing [place] to be a part of your care.       Sabas Sous, MD 07/26/22 [phone]

## 2022-07-26 NOTE — Discharge Instructions (Signed)
You were evaluated in the Emergency Department and after careful evaluation, we did not find any emergent condition requiring admission or further testing in the hospital.  Your exam/testing today is overall reassuring.  X-rays did not show any abnormalities.  Take the prednisone as prescribed starting morning of July 1.  Recommend follow-up with a spine expert if not improving over the next 2 weeks.  Please return to the Emergency Department if you experience any worsening of your condition.   Thank you for allowing [place] to be a part of your care.

## 2022-07-26 NOTE — ED Triage Notes (Signed)
Ambulatory/ POV/ lower back pain with pain radiating down right leg/ x1 month/ hx of sciatica/ took ibuprofen 8 hours ago

## 2022-09-12 ENCOUNTER — Emergency Department (HOSPITAL_COMMUNITY)
Admission: EM | Admit: 2022-09-12 | Discharge: 2022-09-12 | Disposition: A | Discharge status: Home / Self Care | Payer: Self-pay | Attending: Emergency Medicine | Admitting: Emergency Medicine

## 2022-09-12 ENCOUNTER — Other Ambulatory Visit: Payer: Self-pay

## 2022-09-12 ENCOUNTER — Encounter (HOSPITAL_COMMUNITY): Payer: Self-pay | Admitting: Emergency Medicine

## 2022-09-12 DIAGNOSIS — K047 Periapical abscess without sinus: Secondary | ICD-10-CM | POA: Insufficient documentation

## 2022-09-12 MED ORDER — IBUPROFEN 800 MG PO TABS
800.0000 mg | ORAL_TABLET | Freq: Once | ORAL | Status: AC
  Administered 2022-09-12: 800 mg via ORAL
  Filled 2022-09-12: qty 1

## 2022-09-12 MED ORDER — AMOXICILLIN-POT CLAVULANATE 875-125 MG PO TABS
1.0000 | ORAL_TABLET | Freq: Once | ORAL | Status: AC
  Administered 2022-09-12: 1 via ORAL
  Filled 2022-09-12: qty 1

## 2022-09-12 MED ORDER — AMOXICILLIN-POT CLAVULANATE 875-125 MG PO TABS: 1.0000 | ORAL_TABLET | Freq: Two times a day (BID) | ORAL | 0 refills | Status: AC

## 2022-09-12 NOTE — ED Triage Notes (Addendum)
Pt with c/o dental pain for "awhile". States pain is in L lower tooth area and radiates up the side of his face. Pt also states he has been feeling tired lately and "was wondering if we could check his bloodwork".

## 2022-09-12 NOTE — Discharge Instructions (Addendum)
You are seen in the emergency department today for dental pain.  I have concerns that you likely have infection still present in your left lower jaw from prior infection.  You need to be seen by dentist for further evaluation and treatment.  I have sent a prescription for Augmentin to your pharmacy which you should take as prescribed for the next week.  I have also reattached the resource guide for dental providers in the community that can be helpful for you.

## 2022-09-13 NOTE — ED Provider Notes (Signed)
[place] EMERGENCY DEPARTMENT AT [facility] Provider Note   CSN: [id number] Arrival date & time: 09/12/22  2011     History Chief Complaint  Patient presents with   Dental Pain    Jordan Garrett is a 47 y.o. male.  Patient presents to the emergency department concerns of dental pain.  Reports this pain has been ongoing for some time but has not been able to follow-up with a dentist.  Feels that the pain is radiating from his left lower jaw onto the right upper jaw.  Denies any obvious drainage or discharge.  No fevers.  No difficulty swallowing.  Was seen about 2 months ago for similar symptoms.   Dental Pain      Home Medications Prior to Admission medications   Medication Sig Start Date End Date Taking? Authorizing Provider  amoxicillin-clavulanate (AUGMENTIN) 875-125 MG tablet Take 1 tablet by mouth every 12 (twelve) hours for 7 days. 09/12/22 09/19/22 Yes Smitty Knudsen, PA-C  ibuprofen (ADVIL) 600 MG tablet Take 1 tablet (600 mg total) by mouth every 6 (six) hours as needed. 06/29/22   Pollyann Savoy, MD      Allergies    Patient has no known allergies.    Review of Systems   Review of Systems  HENT:  Positive for dental problem.   All other systems reviewed and are negative.   Physical Exam Updated Vital Signs BP (!) 198/124 (BP Location: Right Arm)   Pulse 84   Temp 98.7 F (37.1 C) (Oral)   Resp 18   Ht 5\' 9"  (1.753 m)   Wt 74.8 kg   SpO2 99%   BMI 24.37 kg/m  Physical Exam Vitals and nursing note reviewed.  Constitutional:      General: He is not in acute distress.    Appearance: He is well-developed.  HENT:     Head: Normocephalic and atraumatic.     Mouth/Throat:     Dentition: Abnormal dentition. Gingival swelling and dental caries present.      Comments: Broken teeth with some erythema at the left lower jaw.  No obvious fluctuance. Eyes:     Conjunctiva/sclera: Conjunctivae normal.  Cardiovascular:     Rate and Rhythm:  Normal rate and regular rhythm.     Heart sounds: No murmur heard. Pulmonary:     Effort: Pulmonary effort is normal. No respiratory distress.     Breath sounds: Normal breath sounds.  Abdominal:     Palpations: Abdomen is soft.     Tenderness: There is no abdominal tenderness.  Musculoskeletal:        General: No swelling.     Cervical back: Neck supple.  Skin:    General: Skin is warm and dry.     Capillary Refill: Capillary refill takes less than 2 seconds.  Neurological:     Mental Status: He is alert.  Psychiatric:        Mood and Affect: Mood normal.     ED Results / Procedures / Treatments   Labs (all labs ordered are listed, but only abnormal results are displayed) Labs Reviewed - No data to display  EKG None  Radiology No results found.  Procedures Procedures    Medications Ordered in ED Medications  amoxicillin-clavulanate (AUGMENTIN) 875-125 MG per tablet 1 tablet (1 tablet Oral Given 09/12/22 2150)  ibuprofen (ADVIL) tablet 800 mg (800 mg Oral Given 09/12/22 2150)    ED Course/ Medical Decision Making/ A&P  Medical Decision Making Risk Prescription drug management.   This patient presents to the ED for concern of dental pain.  Differential diagnosis includes dental caries, dental abscess, periapical abscess   Medicines ordered and prescription drug management:  I ordered medication including Augmentin, ibuprofen for dental infection, pain Reevaluation of the patient after these medicines showed that the patient improved I have reviewed the patients home medicines and have made adjustments as needed   Problem List / ED Course:  Patient presents emergency department for dental pain.  Reports has been ongoing for several months.  Seen 2 and 6 months ago for similar concerns.  Reports that the antibiotics he has previously been prescribed have been helpful in alleviating his symptoms.  Has not been out of antibiotics and  states taking over-the-counter medications are providing significant improvement or relief in symptoms.  On examination, there is concern for possible dental infection as there is some erythema and swelling to the teeth of the left lower jaw.  No obvious drainage noted. Dose of Augmentin and ibuprofen given here in the emergency department prior to discharge.  Remainder prescription for Augmentin sent to patient's pharmacy.  Patient provided with dental resource guide for local dental services that he can reach out to to seek evaluation and treatment.  Discussed strict return precautions.  Patient is agreeable with treatment plan and verbalized understanding all return precautions.  All questions answered prior to patient discharge.  Final Clinical Impression(s) / ED Diagnoses Final diagnoses:  Dental infection    Rx / DC Orders ED Discharge Orders          Ordered    amoxicillin-clavulanate (AUGMENTIN) 875-125 MG tablet  Every 12 hours        09/12/22 2141              Smitty Knudsen, PA-C 09/13/22 1336    Loetta Rough, MD 09/13/22 1507

## 2023-06-21 ENCOUNTER — Emergency Department (HOSPITAL_COMMUNITY)
Admission: EM | Admit: 2023-06-21 | Discharge: 2023-06-21 | Disposition: A | Discharge status: Home / Self Care | Payer: Self-pay | Attending: Emergency Medicine | Admitting: Emergency Medicine

## 2023-06-21 ENCOUNTER — Other Ambulatory Visit: Payer: Self-pay

## 2023-06-21 ENCOUNTER — Encounter (HOSPITAL_COMMUNITY): Payer: Self-pay | Admitting: *Deleted

## 2023-06-21 DIAGNOSIS — L84 Corns and callosities: Secondary | ICD-10-CM | POA: Insufficient documentation

## 2023-06-21 MED ORDER — UREA 20 % EX CREA
TOPICAL_CREAM | Freq: Two times a day (BID) | CUTANEOUS | 0 refills | Status: AC
Start: 2023-06-21 — End: ?

## 2023-06-21 NOTE — ED Provider Notes (Signed)
  [place] EMERGENCY DEPARTMENT AT [facility] Provider Note   CSN: [id number] Arrival date & time: 06/21/23  1121     History {Add pertinent medical, surgical, social history, OB history to HPI:1} No chief complaint on file.   Jordan Garrett is a 48 y.o. male   The history is provided by the patient.       Home Medications Prior to Admission medications   Medication Sig Start Date End Date Taking? Authorizing Provider  ibuprofen  (ADVIL ) 600 MG tablet Take 1 tablet (600 mg total) by mouth every 6 (six) hours as needed. 06/29/22   Charmayne Cooper, MD      Allergies    Patient has no known allergies.    Review of Systems   Review of Systems  Physical Exam Updated Vital Signs BP (!) 186/107 (BP Location: Right Arm)   Pulse 74   Temp 97.8 F (36.6 C) (Oral)   Resp 16   Ht 5\' 9"  (1.753 m)   Wt 74.8 kg   SpO2 100%   BMI 24.37 kg/m  Physical Exam  ED Results / Procedures / Treatments   Labs (all labs ordered are listed, but only abnormal results are displayed) Labs Reviewed - No data to display  EKG None  Radiology No results found.  Procedures Procedures  {Document cardiac monitor, telemetry assessment procedure when appropriate:1}  Medications Ordered in ED Medications - No data to display  ED Course/ Medical Decision Making/ A&P   {   Click here for ABCD2, HEART and other calculatorsREFRESH Note before signing :1}                              Medical Decision Making  ***  {Document critical care time when appropriate:1} {Document review of labs and clinical decision tools ie heart score, Chads2Vasc2 etc:1}  {Document your independent review of radiology images, and any outside records:1} {Document your discussion with family members, caretakers, and with consultants:1} {Document social determinants of health affecting pt's care:1} {Document your decision making why or why not admission, treatments were needed:1} Final Clinical  Impression(s) / ED Diagnoses Final diagnoses:  None    Rx / DC Orders ED Discharge Orders     None

## 2023-06-21 NOTE — ED Triage Notes (Signed)
 Pt states he has a bump rt great toe and second toe, states it has drained some but not currently.

## 2023-06-21 NOTE — ED Notes (Signed)
 Pt provided discharge instructions and prescription information. Pt was given the opportunity to ask questions and questions were answered.

## 2023-06-21 NOTE — Discharge Instructions (Signed)
 I recommend a warm water soak of your foot for 10 minutes twice daily followed by application of the cream prescribed.  Gentle exfoliation can also help heal this quicker.

## 2023-07-26 ENCOUNTER — Encounter (HOSPITAL_COMMUNITY): Payer: Self-pay

## 2023-07-26 ENCOUNTER — Other Ambulatory Visit: Payer: Self-pay

## 2023-07-26 ENCOUNTER — Emergency Department (HOSPITAL_COMMUNITY)
Admission: EM | Admit: 2023-07-26 | Discharge: 2023-07-26 | Disposition: A | Discharge status: Home / Self Care | Payer: Self-pay | Attending: Emergency Medicine | Admitting: Emergency Medicine

## 2023-07-26 DIAGNOSIS — K0889 Other specified disorders of teeth and supporting structures: Secondary | ICD-10-CM | POA: Insufficient documentation

## 2023-07-26 DIAGNOSIS — I10 Essential (primary) hypertension: Secondary | ICD-10-CM | POA: Insufficient documentation

## 2023-07-26 MED ORDER — AMOXICILLIN-POT CLAVULANATE 875-125 MG PO TABS: 1.0000 | ORAL_TABLET | Freq: Two times a day (BID) | ORAL | 0 refills | Status: AC

## 2023-07-26 NOTE — ED Notes (Signed)
 Pt inquiring about dental resources. Provided pt with support services paperwork as at least a place to start looking for more affordable dental care.

## 2023-07-26 NOTE — ED Triage Notes (Signed)
 Pt had bad area in mouth that now is painful. Bad tooth/teeth, he states he has to get them all out but recently has had more pain and inflammation and is hoping for antibiotics. Pt states he's on a wait list for dentist. C/o pain front left around canine.

## 2023-07-26 NOTE — Discharge Instructions (Addendum)
 Antibiotics were sent to your pharmacy.  Ensure that you see a dentist soon as possible for management of your decayed tooth.

## 2023-07-26 NOTE — ED Provider Notes (Signed)
 [place] EMERGENCY DEPARTMENT AT [facility] Provider Note   CSN: [id number] Arrival date & time: 07/26/23  9291     Patient presents with: Dental Pain   Jordan Garrett is a 48 y.o. male.    Dental Pain Patient presents for dental pain.  Medical history includes attention deficit disorder.  He has a long history with poor dentition.  He will have intermittent pain in his decayed teeth.  His left canine has been significantly decayed for a while.  The associated pain has recently worsened.  Pain is kept him up all night.  He treats it with over-the-counter medications.  He denies any systemic symptoms.     Prior to Admission medications   Medication Sig Start Date End Date Taking? Authorizing Provider  amoxicillin -clavulanate (AUGMENTIN ) 875-125 MG tablet Take 1 tablet by mouth every 12 (twelve) hours. 07/26/23  Yes Melvenia Motto, MD  ibuprofen  (ADVIL ) 600 MG tablet Take 1 tablet (600 mg total) by mouth every 6 (six) hours as needed. 06/29/22   Roselyn Carlin NOVAK, MD  urea  (CARMOL) 20 % cream Apply topically 2 (two) times daily. 06/21/23   Idol, Julie, PA-C    Allergies: Patient has no known allergies.    Review of Systems  HENT:  Positive for dental problem.   All other systems reviewed and are negative.   Updated Vital Signs BP (!) 192/121 (BP Location: Left Arm)   Pulse (!) 102   Temp 98.4 F (36.9 C) (Oral)   Resp 20   Ht 5' 10 (1.778 m)   Wt 74.8 kg   SpO2 97%   BMI 23.68 kg/m   Physical Exam Vitals and nursing note reviewed.  Constitutional:      General: He is not in acute distress.    Appearance: Normal appearance. He is well-developed. He is not ill-appearing, toxic-appearing or diaphoretic.  HENT:     Head: Normocephalic and atraumatic.     Right Ear: External ear normal.     Left Ear: External ear normal.     Nose: Nose normal.     Mouth/Throat:     Mouth: Mucous membranes are moist.     Comments: Multiple decayed teeth, including painful  left upper canine.  Minimal gingival erythema.  No areas of drainage, fluctuance or induration.  Eyes:     Extraocular Movements: Extraocular movements intact.     Conjunctiva/sclera: Conjunctivae normal.    Cardiovascular:     Rate and Rhythm: Normal rate and regular rhythm.  Pulmonary:     Effort: Pulmonary effort is normal. No respiratory distress.  Abdominal:     General: There is no distension.     Palpations: Abdomen is soft.     Tenderness: There is no abdominal tenderness.   Musculoskeletal:        General: Normal range of motion.     Cervical back: Normal range of motion and neck supple.   Skin:    General: Skin is warm and dry.   Neurological:     General: No focal deficit present.     Mental Status: He is alert and oriented to person, place, and time.   Psychiatric:        Mood and Affect: Mood normal.        Behavior: Behavior normal.     (all labs ordered are listed, but only abnormal results are displayed) Labs Reviewed - No data to display  EKG: None  Radiology: No results found.   Procedures  Medications Ordered in the ED - No data to display                                  Medical Decision Making  Patient presents for dental pain.  Tooth concern is left upper canine.  Pain is kept him up during the night.  He has had issues like this before feels like he needs antibiotics.  His vital signs on arrival are notable for hypertension.  Patient endorses a history of whitecoat hypertension.  He states that his blood pressure is typically elevated in this range when he comes to the ED or see his doctors in general.  The area of pain is notable for a significantly decayed tooth.  There is minimal gingival erythema.  There are no areas of swelling, induration, or fluctuance.  Patient declines any pain medication in the ED.  He was provided with prescription for antibiotic.  He is working on dental follow-up for long-term management.  He was provided with  printed list of dental contacts.  He was discharged in stable condition.     Final diagnoses:  Pain, dental    ED Discharge Orders          Ordered    amoxicillin -clavulanate (AUGMENTIN ) 875-125 MG tablet  Every 12 hours        07/26/23 0745               Melvenia Motto, MD 07/26/23 [phone]

## 2023-07-26 NOTE — ED Notes (Signed)
 Pt needs note for work for today. Pt has used ibuprofen , goody's, orajel, all the OTC things.

## 2024-01-22 ENCOUNTER — Other Ambulatory Visit: Payer: Self-pay

## 2024-01-22 ENCOUNTER — Emergency Department (HOSPITAL_COMMUNITY)
Admission: EM | Admit: 2024-01-22 | Discharge: 2024-01-22 | Disposition: A | Discharge status: Home / Self Care | Payer: Self-pay | Attending: Emergency Medicine | Admitting: Emergency Medicine

## 2024-01-22 DIAGNOSIS — M5432 Sciatica, left side: Secondary | ICD-10-CM | POA: Insufficient documentation

## 2024-01-22 MED ORDER — DEXAMETHASONE SOD PHOSPHATE PF 10 MG/ML IJ SOLN
10.0000 mg | Freq: Once | INTRAMUSCULAR | Status: AC
  Administered 2024-01-22: 10 mg via INTRAMUSCULAR

## 2024-01-22 MED ORDER — CYCLOBENZAPRINE HCL 5 MG PO TABS: 5.0000 mg | ORAL_TABLET | Freq: Three times a day (TID) | ORAL | 0 refills | Status: AC | PRN

## 2024-01-22 MED ORDER — PREDNISONE 10 MG PO TABS: ORAL_TABLET | ORAL | 0 refills | Status: AC

## 2024-01-22 MED ORDER — CYCLOBENZAPRINE HCL 10 MG PO TABS
10.0000 mg | ORAL_TABLET | Freq: Once | ORAL | Status: DC
  Filled 2024-01-22: qty 1

## 2024-01-22 NOTE — ED Notes (Signed)
 ED Provider at bedside.

## 2024-01-22 NOTE — ED Provider Notes (Signed)
 " [facility] Provider Note   CSN: [id number] Arrival date & time: 01/22/24  9046     Patient presents with: Back Pain and Leg Pain   Jordan Garrett is a 48 y.o. male with past medical history presenting with left lower back pain which she describes as intermittent for the past several months, but has been worse since yesterday, now describing pain that shoots down to into his left lateral to anterior thigh.  He denies any obvious injuries or falls, but did been sometime last week helping to lift heavy wood up an incline at work and feels this may be contributing to his worsening symptoms.  He denies any dysuria, no urinary or fecal incontinence or retention, he also denies weakness in his leg but states when the pain is severe he has difficulty staying on his feet.  He has found a site in his hip that he can press that allows him to walk with less pain.  He has taken ibuprofen  with transient improvement in symptoms.  Denies fevers or chills, no IVDU.   The history is provided by the patient.       Prior to Admission medications  Medication Sig Start Date End Date Taking? Authorizing Provider  cyclobenzaprine  (FLEXERIL ) 5 MG tablet Take 1 tablet (5 mg total) by mouth 3 (three) times daily as needed for muscle spasms. 01/22/24  Yes Tanieka Pownall, Mliss, PA-C  predniSONE  (DELTASONE ) 10 MG tablet 6, 5, 4, 3, 2 then 1 tablet by mouth daily for 6 days total. 01/22/24  Yes Genavive Kubicki, PA-C  amoxicillin -clavulanate (AUGMENTIN ) 875-125 MG tablet Take 1 tablet by mouth every 12 (twelve) hours. 07/26/23   Melvenia Motto, MD  ibuprofen  (ADVIL ) 600 MG tablet Take 1 tablet (600 mg total) by mouth every 6 (six) hours as needed. 06/29/22   Roselyn Carlin NOVAK, MD  urea  (CARMOL) 20 % cream Apply topically 2 (two) times daily. 06/21/23   Iden Stripling, PA-C    Allergies: Patient has no known allergies.    Review of Systems  Constitutional:  Negative for fever.  Respiratory:   Negative for shortness of breath.   Cardiovascular:  Negative for chest pain and leg swelling.  Gastrointestinal:  Negative for abdominal distention, abdominal pain and constipation.  Genitourinary:  Negative for difficulty urinating, dysuria, flank pain, frequency and urgency.  Musculoskeletal:  Positive for back pain. Negative for gait problem and joint swelling.  Skin:  Negative for rash.  Neurological:  Negative for weakness and numbness.    Updated Vital Signs BP (!) 161/107   Pulse 99   Temp 98.1 F (36.7 C) (Oral)   Resp 17   Ht 5' 10 (1.778 m)   Wt 74.8 kg   SpO2 98%   BMI 23.68 kg/m   Physical Exam Vitals and nursing note reviewed.  Constitutional:      Appearance: He is well-developed.  HENT:     Head: Normocephalic.  Eyes:     Conjunctiva/sclera: Conjunctivae normal.  Cardiovascular:     Rate and Rhythm: Normal rate.     Pulses: Normal pulses.     Comments: Pedal pulses normal. Pulmonary:     Effort: Pulmonary effort is normal.  Abdominal:     General: Bowel sounds are normal. There is no distension.     Palpations: Abdomen is soft. There is no mass.  Musculoskeletal:        General: Normal range of motion.     Cervical back: Normal  range of motion and neck supple.     Lumbar back: Tenderness present. No swelling, edema or spasms.  Skin:    General: Skin is warm and dry.  Neurological:     Mental Status: He is alert.     Sensory: No sensory deficit.     Motor: No tremor or atrophy.     Gait: Gait normal.     Deep Tendon Reflexes:     Reflex Scores:      Patellar reflexes are 2+ on the right side and 2+ on the left side.      Achilles reflexes are 2+ on the right side and 2+ on the left side.    Comments: No strength deficit noted in hip and knee flexor and extensor muscle groups.  Ankle flexion and extension intact. Positive SLR left at 45 degrees.     (all labs ordered are listed, but only abnormal results are displayed) Labs Reviewed - No data  to display  EKG: None  Radiology: No results found.   Procedures   Medications Ordered in the ED  cyclobenzaprine  (FLEXERIL ) tablet 10 mg (10 mg Oral Not Given 01/22/24 1102)  dexamethasone  (DECADRON ) injection 10 mg (10 mg Intramuscular Given 01/22/24 1103)                                    Medical Decision Making Patient presenting with left lower back pain with radiation to his left lateral and anterior thigh suggesting acute sciatica.  He has no clinical symptoms or by exam concern for cauda equina, discitis, epidural abscess.  No recent falls, but he does describe a heavy lifting event this past week at work, potentially triggering today's presenting symptoms.  We did discuss imaging, patient defers at this time.  We discussed home treatment including activities, body positions to best help resolve stress to his lower back, discussed heat therapy.  Also discussed return precautions.  He was placed on Flexeril  and prednisone , he was given an IM dose of steroid here in was actually finding some relief already at time of discharge.  Risk Prescription drug management.        Final diagnoses:  Sciatica of left side    ED Discharge Orders          Ordered    cyclobenzaprine  (FLEXERIL ) 5 MG tablet  3 times daily PRN        01/22/24 1150    predniSONE  (DELTASONE ) 10 MG tablet        01/22/24 1150               Birdena Clarity, PA-C 01/22/24 1744  "

## 2024-01-22 NOTE — ED Triage Notes (Signed)
 Pt arrived POV with c/o back pain and left leg pain off and on for months. Today the shooting pain is worse.

## 2024-01-22 NOTE — ED Notes (Signed)
 Pt verbalized understanding of discharge instructions. Opportunity for questions provided.

## 2024-01-22 NOTE — Discharge Instructions (Signed)
 As discussed, I do recommend staying as active as you comfortably can but if any activity worsens your pain you should refrain from these activities.  I do recommend a heating pad applied to your lower back and hip area for 20 minutes 3-4 times daily.  Take your next dose of the prednisone  tomorrow morning as the injection you received here today will cover you for today.  Get rechecked immediately for any weakness, numbness or loss of bladder or bowel control as these can be symptoms of a worsening sciatica complication.
